# Patient Record
Sex: Male | Born: 1955 | Race: White | Hispanic: No | Marital: Married | Smoking: Current some day smoker
Health system: Southern US, Community
[De-identification: ages and names within clinical notes are randomized; demographics above are authoritative.]

## PROBLEM LIST (undated history)

## (undated) DIAGNOSIS — I10 Essential (primary) hypertension: Secondary | ICD-10-CM

## (undated) DIAGNOSIS — E785 Hyperlipidemia, unspecified: Secondary | ICD-10-CM

## (undated) HISTORY — PX: JOINT REPLACEMENT: SHX530

---

## 2004-05-07 ENCOUNTER — Encounter: Admission: RE | Admit: 2004-05-07 | Discharge: 2004-05-07 | Payer: Self-pay | Admitting: Orthopedic Surgery

## 2005-07-07 ENCOUNTER — Encounter: Admission: RE | Admit: 2005-07-07 | Discharge: 2005-07-07 | Payer: Self-pay | Admitting: Orthopedic Surgery

## 2006-06-02 ENCOUNTER — Ambulatory Visit: Payer: Self-pay | Admitting: Internal Medicine

## 2006-06-12 ENCOUNTER — Ambulatory Visit: Payer: Self-pay | Admitting: Internal Medicine

## 2006-08-21 ENCOUNTER — Encounter (INDEPENDENT_AMBULATORY_CARE_PROVIDER_SITE_OTHER): Payer: Self-pay | Admitting: *Deleted

## 2006-08-21 ENCOUNTER — Inpatient Hospital Stay (HOSPITAL_COMMUNITY): Admission: RE | Admit: 2006-08-21 | Discharge: 2006-08-23 | Payer: Self-pay | Admitting: Orthopedic Surgery

## 2009-06-01 ENCOUNTER — Inpatient Hospital Stay (HOSPITAL_COMMUNITY): Admission: RE | Admit: 2009-06-01 | Discharge: 2009-06-03 | Payer: Self-pay | Admitting: Orthopedic Surgery

## 2009-06-11 ENCOUNTER — Emergency Department (HOSPITAL_COMMUNITY): Admission: EM | Admit: 2009-06-11 | Discharge: 2009-06-11 | Payer: Self-pay | Admitting: Family Medicine

## 2009-06-26 ENCOUNTER — Inpatient Hospital Stay (HOSPITAL_COMMUNITY): Admission: EM | Admit: 2009-06-26 | Discharge: 2009-06-26 | Payer: Self-pay | Admitting: Emergency Medicine

## 2009-07-02 ENCOUNTER — Observation Stay (HOSPITAL_COMMUNITY): Admission: EM | Admit: 2009-07-02 | Discharge: 2009-07-03 | Payer: Self-pay | Admitting: Emergency Medicine

## 2010-09-27 LAB — DIFFERENTIAL
Basophils Absolute: 0.1 10*3/uL (ref 0.0–0.1)
Basophils Absolute: 0.1 10*3/uL (ref 0.0–0.1)
Basophils Relative: 1 % (ref 0–1)
Basophils Relative: 1 % (ref 0–1)
Eosinophils Absolute: 0.2 10*3/uL (ref 0.0–0.7)
Eosinophils Absolute: 0.2 10*3/uL (ref 0.0–0.7)
Eosinophils Relative: 1 % (ref 0–5)
Eosinophils Relative: 2 % (ref 0–5)
Lymphocytes Relative: 18 % (ref 12–46)
Lymphocytes Relative: 21 % (ref 12–46)
Lymphs Abs: 2.7 10*3/uL (ref 0.7–4.0)
Lymphs Abs: 2.2 10*3/uL (ref 0.7–4.0)
Monocytes Absolute: 1.5 10*3/uL — ABNORMAL HIGH (ref 0.1–1.0)
Monocytes Absolute: 0.8 10*3/uL (ref 0.1–1.0)
Monocytes Relative: 10 % (ref 3–12)
Monocytes Relative: 8 % (ref 3–12)
Neutro Abs: 10.3 10*3/uL — ABNORMAL HIGH (ref 1.7–7.7)
Neutro Abs: 7.4 10*3/uL (ref 1.7–7.7)
Neutrophils Relative %: 70 % (ref 43–77)
Neutrophils Relative %: 69 % (ref 43–77)

## 2010-09-27 LAB — CBC
HCT: 34.5 % — ABNORMAL LOW (ref 39.0–52.0)
HCT: 29.8 % — ABNORMAL LOW (ref 39.0–52.0)
Hemoglobin: 11.9 g/dL — ABNORMAL LOW (ref 13.0–17.0)
Hemoglobin: 10.1 g/dL — ABNORMAL LOW (ref 13.0–17.0)
MCHC: 34.6 g/dL (ref 30.0–36.0)
MCHC: 34 g/dL (ref 30.0–36.0)
MCV: 92.5 fL (ref 78.0–100.0)
MCV: 92 fL (ref 78.0–100.0)
Platelets: 597 10*3/uL — ABNORMAL HIGH (ref 150–400)
Platelets: 374 10*3/uL (ref 150–400)
RBC: 3.73 MIL/uL — ABNORMAL LOW (ref 4.22–5.81)
RBC: 3.23 MIL/uL — ABNORMAL LOW (ref 4.22–5.81)
RDW: 12.5 % (ref 11.5–15.5)
RDW: 13.5 % (ref 11.5–15.5)
WBC: 14.8 10*3/uL — ABNORMAL HIGH (ref 4.0–10.5)
WBC: 10.7 10*3/uL — ABNORMAL HIGH (ref 4.0–10.5)

## 2010-09-27 LAB — POCT I-STAT, CHEM 8
BUN: 46 mg/dL — ABNORMAL HIGH (ref 6–23)
BUN: 24 mg/dL — ABNORMAL HIGH (ref 6–23)
Calcium, Ion: 1.19 mmol/L (ref 1.12–1.32)
Calcium, Ion: 1.15 mmol/L (ref 1.12–1.32)
Chloride: 107 mEq/L (ref 96–112)
Chloride: 107 mEq/L (ref 96–112)
Creatinine, Ser: 2.8 mg/dL — ABNORMAL HIGH (ref 0.4–1.5)
Creatinine, Ser: 1.5 mg/dL (ref 0.4–1.5)
Glucose, Bld: 81 mg/dL (ref 70–99)
Glucose, Bld: 97 mg/dL (ref 70–99)
HCT: 36 % — ABNORMAL LOW (ref 39.0–52.0)
HCT: 31 % — ABNORMAL LOW (ref 39.0–52.0)
Hemoglobin: 12.2 g/dL — ABNORMAL LOW (ref 13.0–17.0)
Hemoglobin: 10.5 g/dL — ABNORMAL LOW (ref 13.0–17.0)
Potassium: 4.9 mEq/L (ref 3.5–5.1)
Potassium: 3.4 mEq/L — ABNORMAL LOW (ref 3.5–5.1)
Sodium: 139 mEq/L (ref 135–145)
Sodium: 141 mEq/L (ref 135–145)
TCO2: 29 mmol/L (ref 0–100)
TCO2: 21 mmol/L (ref 0–100)

## 2010-09-27 LAB — PROTIME-INR
INR: 1 (ref 0.00–1.49)
Prothrombin Time: 13.1 seconds (ref 11.6–15.2)

## 2010-09-27 LAB — APTT: aPTT: 27 seconds (ref 24–37)

## 2010-09-28 LAB — CBC
HCT: 30.6 % — ABNORMAL LOW (ref 39.0–52.0)
HCT: 31.1 % — ABNORMAL LOW (ref 39.0–52.0)
HCT: 39.4 % (ref 39.0–52.0)
Hemoglobin: 10.4 g/dL — ABNORMAL LOW (ref 13.0–17.0)
Hemoglobin: 10.6 g/dL — ABNORMAL LOW (ref 13.0–17.0)
Hemoglobin: 13.6 g/dL (ref 13.0–17.0)
MCHC: 33.9 g/dL (ref 30.0–36.0)
MCHC: 34 g/dL (ref 30.0–36.0)
MCHC: 34.4 g/dL (ref 30.0–36.0)
MCV: 92.4 fL (ref 78.0–100.0)
MCV: 93.3 fL (ref 78.0–100.0)
MCV: 93.3 fL (ref 78.0–100.0)
Platelets: 236 10*3/uL (ref 150–400)
Platelets: 270 10*3/uL (ref 150–400)
Platelets: 342 10*3/uL (ref 150–400)
RBC: 3.28 MIL/uL — ABNORMAL LOW (ref 4.22–5.81)
RBC: 3.34 MIL/uL — ABNORMAL LOW (ref 4.22–5.81)
RBC: 4.26 MIL/uL (ref 4.22–5.81)
RDW: 12.3 % (ref 11.5–15.5)
RDW: 12.6 % (ref 11.5–15.5)
RDW: 13 % (ref 11.5–15.5)
WBC: 10.7 10*3/uL — ABNORMAL HIGH (ref 4.0–10.5)
WBC: 11.8 10*3/uL — ABNORMAL HIGH (ref 4.0–10.5)
WBC: 14.8 10*3/uL — ABNORMAL HIGH (ref 4.0–10.5)

## 2010-09-28 LAB — URINE MICROSCOPIC-ADD ON

## 2010-09-28 LAB — URINALYSIS, ROUTINE W REFLEX MICROSCOPIC
Bilirubin Urine: NEGATIVE
Glucose, UA: NEGATIVE mg/dL
Hgb urine dipstick: NEGATIVE
Ketones, ur: NEGATIVE mg/dL
Leukocytes, UA: NEGATIVE
Nitrite: NEGATIVE
Protein, ur: 100 mg/dL — AB
Specific Gravity, Urine: 1.026 (ref 1.005–1.030)
Urobilinogen, UA: 0.2 mg/dL (ref 0.0–1.0)
pH: 7 (ref 5.0–8.0)

## 2010-09-28 LAB — BASIC METABOLIC PANEL
BUN: 18 mg/dL (ref 6–23)
BUN: 23 mg/dL (ref 6–23)
CO2: 27 mEq/L (ref 19–32)
CO2: 29 mEq/L (ref 19–32)
Calcium: 8.3 mg/dL — ABNORMAL LOW (ref 8.4–10.5)
Calcium: 8.4 mg/dL (ref 8.4–10.5)
Chloride: 101 mEq/L (ref 96–112)
Chloride: 102 mEq/L (ref 96–112)
Creatinine, Ser: 1.11 mg/dL (ref 0.4–1.5)
Creatinine, Ser: 1.22 mg/dL (ref 0.4–1.5)
GFR calc Af Amer: >60 mL/min (ref >60)
GFR calc Af Amer: >60 mL/min (ref >60)
GFR calc non Af Amer: >60 mL/min (ref >60)
GFR calc non Af Amer: >60 mL/min (ref >60)
Glucose, Bld: 116 mg/dL — ABNORMAL HIGH (ref 70–99)
Glucose, Bld: 133 mg/dL — ABNORMAL HIGH (ref 70–99)
Potassium: 3.8 mEq/L (ref 3.5–5.1)
Potassium: 3.9 mEq/L (ref 3.5–5.1)
Sodium: 138 mEq/L (ref 135–145)
Sodium: 138 mEq/L (ref 135–145)

## 2010-09-28 LAB — COMPREHENSIVE METABOLIC PANEL
ALT: 107 U/L — ABNORMAL HIGH (ref 0–53)
AST: 45 U/L — ABNORMAL HIGH (ref 0–37)
Albumin: 4.3 g/dL (ref 3.5–5.2)
Alkaline Phosphatase: 81 U/L (ref 39–117)
BUN: 29 mg/dL — ABNORMAL HIGH (ref 6–23)
CO2: 28 mEq/L (ref 19–32)
Calcium: 9.2 mg/dL (ref 8.4–10.5)
Chloride: 105 mEq/L (ref 96–112)
Creatinine, Ser: 1.39 mg/dL (ref 0.4–1.5)
GFR calc Af Amer: >60 mL/min (ref >60)
GFR calc non Af Amer: 53 mL/min — ABNORMAL LOW (ref >60)
Glucose, Bld: 99 mg/dL (ref 70–99)
Potassium: 4.5 mEq/L (ref 3.5–5.1)
Sodium: 141 mEq/L (ref 135–145)
Total Bilirubin: 0.5 mg/dL (ref 0.3–1.2)
Total Protein: 7 g/dL (ref 6.0–8.3)

## 2010-09-28 LAB — PROTIME-INR
INR: 0.88 (ref 0.00–1.49)
Prothrombin Time: 11.9 seconds (ref 11.6–15.2)

## 2010-09-28 LAB — CROSSMATCH
ABO/RH(D): O POS
Antibody Screen: NEGATIVE

## 2010-09-28 LAB — DIFFERENTIAL
Basophils Absolute: 0.1 10*3/uL (ref 0.0–0.1)
Basophils Relative: 1 % (ref 0–1)
Eosinophils Absolute: 0.2 10*3/uL (ref 0.0–0.7)
Eosinophils Relative: 2 % (ref 0–5)
Lymphocytes Relative: 31 % (ref 12–46)
Lymphs Abs: 3.3 10*3/uL (ref 0.7–4.0)
Monocytes Absolute: 1.2 10*3/uL — ABNORMAL HIGH (ref 0.1–1.0)
Monocytes Relative: 11 % (ref 3–12)
Neutro Abs: 5.9 10*3/uL (ref 1.7–7.7)
Neutrophils Relative %: 55 % (ref 43–77)

## 2010-09-28 LAB — APTT: aPTT: 27 seconds (ref 24–37)

## 2010-11-12 NOTE — Op Note (Signed)
NAMEKENDEN, Tyler Tate              ACCOUNT NO.:  [id number]   MEDICAL RECORD NO.:  [id number]          PATIENT TYPE:  INP   LOCATION:  5012                         FACILITY:  [facility]   PHYSICIAN:  Mila Homer. Sherlean Foot, M.D. DATE OF BIRTH:  08/01/1955   DATE OF PROCEDURE:  08/21/2006  DATE OF DISCHARGE:  08/23/2006                               OPERATIVE REPORT   SURGEON:  Mila Homer. Sherlean Foot, M.D.   ASSISTANTArlys John D. Petrarca, P.A.-C.   PREOPERATIVE DIAGNOSIS:  Right hip osteoarthritis.   POSTOPERATIVE DIAGNOSIS:  Right hip osteoarthritis.   PROCEDURE:  Right total hip arthroplasty.   ANESTHESIA:  General.   COMPLICATIONS:  None.   DRAINS:  None.   INDICATIONS FOR PROCEDURE:  The patient is a 55 year old white male with  failure of conservative treatment for osteoarthritis of the right hip.  Informed consent was obtained.   DESCRIPTION OF PROCEDURE:  The patient was laid supine and administered  general anesthesia.  The right hip was prepped and draped in the usual  sterile fashion with the left-down, right lateral decubitus position.  A  #10 blade was used to make a southern incision directed over the center  of the trochanter.  I then placed a Charnley retractor after incising  the fascia lata.  I removed the bursa.  I then made an incision through  the interval of the anterior 1/2 of the gluteus medius, minimus and  lateralis.  I elevated this all as a single sleeve of tissue.  I tagged  it with 3 stay sutures with #2 Tevdek.  I then performed an anterior hip  capsulectomy with the hip in extension and external rotation.  I then  placed a Hohmann retractor anteriorly.  I then used the neck guide to  mark out the femoral neck cut and made the cut with a reciprocating saw.  I removed the cut surface of the neck and head and placed Hohmann  retractors anteriorly and posteriorly.  I switched sides of the table  with my physician assistant.  I cleaned out the acetabulum of the  labrum  and ligamentum teres.  I then reamed sequentially up to 58 mm and put in  a 60-mm, no-holes, no-spike cup, and then went to the back side of the  table.  I then flexed the hip and externally rotated into a sterile  pouch off the anterior side of the table.  I then used a canal finder,  followed by reamers.  I reamed up to a size 13, and then broached to a  size 13 as well.  At this point, I followed with a broach and broached  up to size 13.  At this point, I trialed with a 0 head and used a 36 x  +3.5 head and an anterior lip liner, since I felt that the acetabulum  was put in a little bit of anteversion.  With this liner and this head  in place, I had excellent stability and leg lengths.  I then dislocated  the hip and isolated the acetabulum.  I tamped in the acetabular liner,  which  was, again, to receive a 36 head with an anterior lip liner.  This  was placed in approximately the 2-o'clock position.  I then put a fully  porous-coated, size-13 stem down with a +3.5 x 36-mm head, located the  hip and took it through an aggressive  motion and was very happy with this.  I then lavaged and closed the  medius, minimus, lateralis interval through drill holes, and oversewed  with figure-of-eight #2 Tevdek sutures.  I then closed the fascia lata  with interrupted #1 Vicryl, the deep soft tissues with interrupted 0  Vicryl, subcuticular 0 Vicryl and skin staples.           ______________________________  Mila Homer Sherlean Foot, M.D.     SDL/MEDQ  D:  08/25/2006  T:  08/26/2006  Job:  [id number]

## 2010-11-12 NOTE — H&P (Signed)
Tyler Tate, Tyler Tate               ACCOUNT NO.:  [id number]   MEDICAL RECORD NO.:  [id number]           PATIENT TYPE:   LOCATION:                                 FACILITY:   PHYSICIAN:  Mila Homer. Sherlean Foot, M.D. DATE OF BIRTH:  1956/01/04   DATE OF ADMISSION:  08/21/2006  DATE OF DISCHARGE:                              HISTORY & PHYSICAL   CHIEF COMPLAINT:  End-stage osteoarthritis right hip.   HISTORY OF PRESENT ILLNESS:  Tyler Tate is a 55 year old white  male with right hip pain for at least 2 years.  The pain is  progressively worse and described as constant right groin, lateral right  hip pain without radiation.  Positive aching pain.  Failed conservative  treatment which included intraarticular injections.  X-rays of the right  hip show decreased joint space with a very irregular femoral head.   ALLERGIES:  Penicillin causes hives.   MEDICATIONS:  1. Effexor XR 150 mg one daily.  2. Lipitor 40 mg one daily in the a.m.  3. Benicar HCT 12.5 mg one every a.m.  4. Toprol XL 50 mg one every a.m.  5. Prilosec over-the-counter 1 tablet daily.  6. Cosamin chondroitin daily.  7. Fish oil daily.  8. Multi-vitamin daily.  9. Enteric-coated aspirin 81 mg one daily, stop August 15, 2006.   PAST MEDICAL HISTORY:  1. Dyslipidemia.  2. Hypertension.  3. GERD.  4. Seasonal allergies.   PAST SURGICAL HISTORY:  1. Vasectomy.  2. Lumbar spine either L3-L4, or L4-L5 in 1995.  No complications, no      blood transfusions.   SOCIAL HISTORY:  The patient denies any tobacco use.  Drinks 5 to 7  alcoholic beverages per week.  He is married.  He lives in a 2-story  home with 3 steps at the usual entrance, bedroom is on the second floor  15 steps up.   FAMILY HISTORY:  The patient's mother is deceased at age 2 due to  ovarian cancer.  Father, age 74, has a history of nephritis as a child.  He has one living sister who is reportedly healthy.   REVIEW OF SYSTEMS:  Reveals  hypertension, GERD, dyslipidemia, seasonal  allergies.  No recent cold, fever, or flu-like symptoms.  No history of  anemia.  Otherwise, review of systems negative or noncontributory.   PHYSICAL EXAMINATION:  GENERAL:  The patient is a well-developed, well-  nourished male in no acute distress.  The patient does walk with a  slight antalgic gait on the right, no assistive devices.  The patient  mentates perfect, talks easily in the examination room.  VITAL SIGNS:  Temperature is 98.1 degrees Fahrenheit, pulse 68,  respiratory rate 16, blood pressure 120/70.  HEENT:  Head is normocephalic, atraumatic without frontal or maxillary  sinus tenderness to palpation.  Sclerae is nonicteric.  PERRLA.  EOMs  are intact.  Conjunctivae pink.  No visible external ear deformities.  TMs pearly and gray.  Nose, nares, and septum midline.  Nasal mucosa  pink, moist without polyps.  Buccal mucosa is pink and moist.  The  patient has good dentition.  Oropharynx without erythema or exudate.  CARDIAC:  Regular rate and rhythm.  No murmurs, rubs, or gallops.  CHEST:  Lungs are clear to auscultation bilaterally.  No wheezes, rales,  or rhonchi noted.  ABDOMEN:  Soft, nontender.  Positive bowel sounds times 4 quadrants.  No  hepatomegaly.  No splenomegaly.  NECK:  The patient has full range of motion of the cervical spine  without radicular symptoms.  No lymphadenopathy.  Carotids 2+  bilaterally without bruits.  Trachea is midline.  BACK:  No tenderness to palpation over the lumbar and thoracic vertebral  columns.  BREAST/GENITALIA/URINARY/RECTAL EXAM:  All deferred at this time.  NEUROLOGICAL:  The patient is alert and oriented times 3.  Cranial  nerves II-XII are grossly intact.  Lower extremity strength testing  reveals 5/5 strength throughout.  Deep tendon reflexes are 2+ at the  pelvis and ankles bilaterally.  They are equal and symmetric.  MUSCULOSKELETAL:  Upper extremities; the patient has full range  of  motion in the upper extremities, shoulders, elbows, wrists, and hands.  Radial pulses are 2+ bilaterally.  The patient has good sensation of  fingertips throughout.  Lower extremities;  Left hip has painless range  of motion.  25 degrees of internal rotation, external rotation 30  degrees.  Right hip 5 degrees of internal rotation and is painful.  External rotation is limited to 15 degrees.  Knees:  Bilaterally without  fusion, edema.  Left knee is 0 to 115 degrees of flexion.  Right knee is  0 to 118 degrees of flexion.  There is no joint line tenderness.  Calves  bilaterally are soft and nontender.  Posterior tibial pulses are 2+  bilaterally.   IMPRESSION:  1. End-stage osteoarthritis of the right hip.  2. Hypertension.  3. Dyslipidemia.  4. GERD.  5. Seasonal allergies.   PLAN:  The patient is to undergo all preoperative labs and testing prior  to surgery.  The patient did receive clearance from his primary care  physician, Dr. Geoffry Paradise, prior to surgery.      Richardean Canal, P.A.    ______________________________  Mila Homer. Sherlean Foot, M.D.    GC/MEDQ  D:  08/15/2006  T:  08/16/2006  Job:  [id number]   cc:   Mila Homer. Sherlean Foot, M.D.

## 2011-06-04 ENCOUNTER — Encounter: Payer: Self-pay | Admitting: Internal Medicine

## 2012-03-23 ENCOUNTER — Encounter: Payer: Self-pay | Admitting: Internal Medicine

## 2014-07-24 DIAGNOSIS — R52 Pain, unspecified: Secondary | ICD-10-CM

## 2015-04-28 ENCOUNTER — Other Ambulatory Visit: Payer: Self-pay | Admitting: Orthopedic Surgery

## 2015-04-28 DIAGNOSIS — M25562 Pain in left knee: Secondary | ICD-10-CM

## 2015-05-03 ENCOUNTER — Ambulatory Visit
Admission: RE | Admit: 2015-05-03 | Discharge: 2015-05-03 | Disposition: A | Discharge status: Home / Self Care | Payer: BLUE CROSS/BLUE SHIELD | Source: Ambulatory Visit | Attending: Orthopedic Surgery | Admitting: Orthopedic Surgery

## 2015-05-03 DIAGNOSIS — M25562 Pain in left knee: Secondary | ICD-10-CM

## 2015-09-14 ENCOUNTER — Encounter: Payer: Self-pay | Admitting: Podiatry

## 2015-09-14 ENCOUNTER — Ambulatory Visit (INDEPENDENT_AMBULATORY_CARE_PROVIDER_SITE_OTHER): Payer: BLUE CROSS/BLUE SHIELD

## 2015-09-14 ENCOUNTER — Ambulatory Visit (INDEPENDENT_AMBULATORY_CARE_PROVIDER_SITE_OTHER): Payer: BLUE CROSS/BLUE SHIELD | Admitting: Podiatry

## 2015-09-14 VITALS — BP 166/104 | HR 89 | Resp 16 | Ht 73.0 in | Wt 220.0 lb

## 2015-09-14 DIAGNOSIS — M79671 Pain in right foot: Secondary | ICD-10-CM | POA: Diagnosis not present

## 2015-09-14 DIAGNOSIS — M205X1 Other deformities of toe(s) (acquired), right foot: Secondary | ICD-10-CM | POA: Diagnosis not present

## 2015-09-14 DIAGNOSIS — M779 Enthesopathy, unspecified: Secondary | ICD-10-CM | POA: Diagnosis not present

## 2015-09-14 MED ORDER — TRIAMCINOLONE ACETONIDE 10 MG/ML IJ SUSP
10.0000 mg | Freq: Once | INTRAMUSCULAR | Status: AC
  Administered 2015-09-14: 10 mg

## 2015-09-14 NOTE — Progress Notes (Signed)
   Subjective:    Patient ID: Tyler Tate, male    DOB: [birth year], 60 y.o.   MRN: [id number]  HPI Patient presents with foot pain in their right foot; great toe & lateral. Pt stated, "Thinks may have gout; hurts more when walk on foot"; x2 weeks.   Review of Systems  All other systems reviewed and are negative.      Objective:   Physical Exam        Assessment & Plan:

## 2015-09-15 NOTE — Progress Notes (Signed)
Subjective:     Patient ID: Tyler Tate, male   DOB: 12/22/1955, 60 y.o.   MRN: [id number]  HPI patient presents with quite a bit of discomfort in the lateral side of the right foot and also pain with discomfort around the first MPJ right with limited motion and quite a bit of pain when we tried to palpate the joint surface   Review of Systems     Objective:   Physical Exam Neurovascular status was found to be intact muscle strength adequate range of motion within normal limits. Patient's found to have reduced range of motion first MPJ right with crepitus within the joint and spur formation. There is fluid around the joint with pain and on the lateral side of the right foot within the peroneal group there is quite a bit of discomfort as it inserts into the base of the fifth metatarsal with no loss of tendon function    Assessment:     Inflammatory changes consistent with hallux limitus with capsulitis and compensatory tendinitis lateral foot    Plan:     H&P and conditions reviewed with patient. Today I went ahead and injected the first MPJ 3 mg Kenalog 5 mg Xylocaine and the lateral foot 3 mg Kenalog 5 mg Xylocaine and applied fascial brace to lift up the lateral foot. Instructed on ice and reduced activity and reappoint to recheck again in 4 weeks  X-ray report indicated there is quite a bit of spurring around the first MPJ right with narrowing of the joint surface and indications of arthritis. No indications of pathology lateral

## 2015-10-05 ENCOUNTER — Ambulatory Visit (INDEPENDENT_AMBULATORY_CARE_PROVIDER_SITE_OTHER): Payer: BLUE CROSS/BLUE SHIELD | Admitting: Podiatry

## 2015-10-05 DIAGNOSIS — M205X1 Other deformities of toe(s) (acquired), right foot: Secondary | ICD-10-CM | POA: Diagnosis not present

## 2015-10-05 DIAGNOSIS — M779 Enthesopathy, unspecified: Secondary | ICD-10-CM | POA: Diagnosis not present

## 2015-10-06 NOTE — Progress Notes (Signed)
Subjective:     Patient ID: Tyler Tate, male   DOB: 1955/09/24, [age] y.o.   MRN: [id number]  HPI patient states the toe joint feels quite a bit better but it staff and I know some nail probably need surgery but I like to hold off as long as possible   Review of Systems     Objective:   Physical Exam Neurovascular status intact muscle strength adequate with severe range of motion loss first MPJ right with inflammation and fluid around the joint surface still noted but significant diminishment of discomfort    Assessment:     Improved hallux rigidus condition right with no motion at the first MPJ    Plan:     H&P and condition reviewed. At this point were to placement an orthotic to try to stabilize the first MPJ prevent stress on it and hopefully prevent surgery. Ultimately will probably require joint fusion or joint implantation procedure depending on symptoms orthotics today

## 2015-10-27 ENCOUNTER — Ambulatory Visit (INDEPENDENT_AMBULATORY_CARE_PROVIDER_SITE_OTHER): Payer: BLUE CROSS/BLUE SHIELD | Admitting: *Deleted

## 2015-10-27 DIAGNOSIS — M779 Enthesopathy, unspecified: Secondary | ICD-10-CM

## 2015-10-27 NOTE — Progress Notes (Signed)
Patient ID: Tyler Tate, male   DOB: 08-22-1955, 60 y.o.   MRN: [id number] Patient presents for orthotic pick up.  Verbal and written break in and wear instructions given.  Patient will follow up in 4 weeks if symptoms worsen or fail to improve.

## 2015-10-27 NOTE — Patient Instructions (Signed)

## 2018-01-17 ENCOUNTER — Encounter: Payer: Self-pay | Admitting: Podiatry

## 2018-01-17 ENCOUNTER — Ambulatory Visit: Payer: Self-pay | Admitting: Podiatry

## 2018-01-17 ENCOUNTER — Ambulatory Visit (INDEPENDENT_AMBULATORY_CARE_PROVIDER_SITE_OTHER): Payer: Self-pay

## 2018-01-17 ENCOUNTER — Other Ambulatory Visit: Payer: Self-pay | Admitting: Podiatry

## 2018-01-17 VITALS — BP 140/84 | HR 73

## 2018-01-17 DIAGNOSIS — M79671 Pain in right foot: Secondary | ICD-10-CM

## 2018-01-17 DIAGNOSIS — M7661 Achilles tendinitis, right leg: Secondary | ICD-10-CM

## 2018-01-17 DIAGNOSIS — M205X1 Other deformities of toe(s) (acquired), right foot: Secondary | ICD-10-CM

## 2018-01-17 MED ORDER — DICLOFENAC SODIUM 75 MG PO TBEC: 75.0000 mg | DELAYED_RELEASE_TABLET | Freq: Two times a day (BID) | ORAL | 2 refills | Status: DC

## 2018-01-17 MED ORDER — TRIAMCINOLONE ACETONIDE 10 MG/ML IJ SUSP
10.0000 mg | Freq: Once | INTRAMUSCULAR | Status: AC
  Administered 2018-01-17: 10 mg

## 2018-01-17 NOTE — Progress Notes (Signed)
Subjective:   Patient ID: Tyler ChiquitoPhillip Engebretsen, male   DOB: 62 y.o.   MRN: [id number]   HPI Patient presents stating I am having a lot of pain in the back of my right heel and I like to be active and is very hard for me even to sleep when it hits the ground.  It just started on Monday and I do not remember an injury   ROS      Objective:  Physical Exam  Neurovascular status intact with patient noted to have exquisite discomfort posterior lateral aspect right heel at the insertional point tendon calcaneus with mild equinus but no indication of tendon damage.  Also has significant hallux limitus deformity right with reduced range of motion and digital deformity second digit right foot     Assessment:  Posterior Achilles tendinitis right acute mild nature probable local with possibility for systemic problems and hallux limitus condition reviewed both conditions and I discussed injection explaining chances for rupture and I do not immobilize it after doing the procedure.  He wants to pursue this action and at this point     Plan:  I did a sterile prep of the lateral side of the right Achilles and carefully injected the side not into the center medial side 3 mg dexamethasone Kenalog 5 mg Xylocaine and I applied air fracture walker to completely immobilize.  I then went ahead discussed hallux limitus and we discussed ultimate surgery and he will continue with orthotics.  Patient to be seen back 3 weeks and I did dispense a silicone sleeve to wear when he has to wear shoe for the right foot  X-ray indicates significant severe hallux limitus deformity right with narrow joint motion and minimal spurring posterior heel

## 2018-01-17 NOTE — Patient Instructions (Signed)

## 2018-01-22 ENCOUNTER — Telehealth: Payer: Self-pay | Admitting: Podiatry

## 2018-01-22 NOTE — Telephone Encounter (Signed)
Called pt and left a voicemail letting him know that his office visit note he requested from 24 July is at the front desk for him to pick up at his convenience. I told him if he wanted to wait until his next appointment on 14 August to pick it up that was fine. Told him to call me with any questions at [phone] and to leave a message if I do not answer.

## 2018-02-07 ENCOUNTER — Ambulatory Visit: Payer: BLUE CROSS/BLUE SHIELD | Admitting: Podiatry

## 2019-05-30 ENCOUNTER — Ambulatory Visit (INDEPENDENT_AMBULATORY_CARE_PROVIDER_SITE_OTHER): Payer: BC Managed Care – PPO | Admitting: Podiatry

## 2019-05-30 ENCOUNTER — Ambulatory Visit (INDEPENDENT_AMBULATORY_CARE_PROVIDER_SITE_OTHER): Payer: BC Managed Care – PPO

## 2019-05-30 ENCOUNTER — Encounter: Payer: Self-pay | Admitting: Podiatry

## 2019-05-30 ENCOUNTER — Other Ambulatory Visit: Payer: Self-pay

## 2019-05-30 DIAGNOSIS — M2041 Other hammer toe(s) (acquired), right foot: Secondary | ICD-10-CM

## 2019-05-30 DIAGNOSIS — M2042 Other hammer toe(s) (acquired), left foot: Secondary | ICD-10-CM

## 2019-05-30 DIAGNOSIS — M205X1 Other deformities of toe(s) (acquired), right foot: Secondary | ICD-10-CM | POA: Diagnosis not present

## 2019-05-31 ENCOUNTER — Telehealth: Payer: Self-pay | Admitting: Podiatry

## 2019-05-31 NOTE — Telephone Encounter (Signed)
I saw Dr. Paulla Dolly yesterday and I'm calling to schedule my surgery. If I can, I'd like to get it scheduled for 12/22. I went ahead and scheduled the pt a sx consult appointment on 12/11 at 10:15 to go over and sign the consent forms.

## 2019-06-03 ENCOUNTER — Telehealth: Payer: Self-pay | Admitting: Podiatry

## 2019-06-03 NOTE — Telephone Encounter (Signed)
DOS: 06/18/2019  SURGICAL PROCEDURE: Hammertoe Repair w/ Pin 2nd [id number])  BCBS Policy Effective : 09/81/1914  -  06/27/2019  Member Liability Summary       In-Network   Max Per Benefit Period Year-to-Date Remaining     CoInsurance 0%       Deductible $8150.00 $7829.56     Out-Of-Pocket 3 $8150.00 $7767.17 3 Out-of-Pocket includes copay, deductible, and coinsurance.  [place] Required Not Applicable 0%  per  Service Year No

## 2019-06-03 NOTE — Progress Notes (Signed)
Subjective:   Patient ID: Tyler Tate, male   DOB: 63 y.o.   MRN: [id number]   HPI Patient presents stating the second toe on his right foot has lifted quite significantly and it has become sore recently.  He does not remember specific injury but states it is been ongoing and gradually getting worse over time.  The left has mild deformity but not to the same degree and this has been recent.   ROS      Objective:  Physical Exam  Neurovascular status intact muscle strength found to be adequate with significant elevation of the second digit right foot with elongation and irritation of the proximal phalanx head secondary to irritation     Assessment:  Rigid hammertoe deformity second digit right     Plan:  H&P discussed the nature of deformity in the probability of a flexor plate dislocation.  At this point I do think it would require digital fusion and I reviewed that with the patient and patient understands this and at this time will decide best time and then schedule surgery and I will see him prior to go over greater detail.  I did apply padding to try to take pressure off the toe  X-ray indicates there is medial and dorsal dislocation second digit right foot over left foot

## 2019-06-07 ENCOUNTER — Encounter: Payer: Self-pay | Admitting: Podiatry

## 2019-06-07 ENCOUNTER — Ambulatory Visit (INDEPENDENT_AMBULATORY_CARE_PROVIDER_SITE_OTHER): Payer: BC Managed Care – PPO | Admitting: Podiatry

## 2019-06-07 ENCOUNTER — Other Ambulatory Visit: Payer: Self-pay

## 2019-06-07 DIAGNOSIS — M7751 Other enthesopathy of right foot: Secondary | ICD-10-CM | POA: Diagnosis not present

## 2019-06-07 DIAGNOSIS — M2042 Other hammer toe(s) (acquired), left foot: Secondary | ICD-10-CM | POA: Diagnosis not present

## 2019-06-07 DIAGNOSIS — M2041 Other hammer toe(s) (acquired), right foot: Secondary | ICD-10-CM | POA: Diagnosis not present

## 2019-06-07 DIAGNOSIS — M779 Enthesopathy, unspecified: Secondary | ICD-10-CM

## 2019-06-07 NOTE — Patient Instructions (Signed)
Pre-Operative Instructions  Congratulations, you have decided to take an important step towards improving your quality of life.  You can be assured that the doctors and staff at [facility] will be with you every step of the way.  Here are some important things you should know:  1. Plan to be at the surgery center/hospital at least 1 (one) hour prior to your scheduled time, unless otherwise directed by the surgical center/hospital staff.  You must have a responsible adult accompany you, remain during the surgery and drive you home.  Make sure you have directions to the surgical center/hospital to ensure you arrive on time. 2. If you are having surgery at [facility] or  hospitals, you will need a copy of your medical history and physical form from your family physician within one month prior to the date of surgery. We will give you a form for your primary physician to complete.  3. We make every effort to accommodate the date you request for surgery.  However, there are times where surgery dates or times have to be moved.  We will contact you as soon as possible if a change in schedule is required.   4. No aspirin/ibuprofen for one week before surgery.  If you are on aspirin, any non-steroidal anti-inflammatory medications (Mobic, Aleve, Ibuprofen) should not be taken seven (7) days prior to your surgery.  You make take Tylenol for pain prior to surgery.  5. Medications - If you are taking daily heart and blood pressure medications, seizure, reflux, allergy, asthma, anxiety, pain or diabetes medications, make sure you notify the surgery center/hospital before the day of surgery so they can tell you which medications you should take or avoid the day of surgery. 6. No food or drink after midnight the night before surgery unless directed otherwise by surgical center/hospital staff. 7. No alcoholic beverages 24-hours prior to surgery.  No smoking 24-hours prior or 24-hours after  surgery. 8. Wear loose pants or shorts. They should be loose enough to fit over bandages, boots, and casts. 9. Don't wear slip-on shoes. Sneakers are preferred. 10. Bring your boot with you to the surgery center/hospital.  Also bring crutches or a walker if your physician has prescribed it for you.  If you do not have this equipment, it will be provided for you after surgery. 11. If you have not been contacted by the surgery center/hospital by the day before your surgery, call to confirm the date and time of your surgery. 12. Leave-time from work may vary depending on the type of surgery you have.  Appropriate arrangements should be made prior to surgery with your employer. 13. Prescriptions will be provided immediately following surgery by your doctor.  Fill these as soon as possible after surgery and take the medication as directed. Pain medications will not be refilled on weekends and must be approved by the doctor. 14. Remove nail polish on the operative foot and avoid getting pedicures prior to surgery. 15. Wash the night before surgery.  The night before surgery wash the foot and leg well with water and the antibacterial soap provided. Be sure to pay special attention to beneath the toenails and in between the toes.  Wash for at least three (3) minutes. Rinse thoroughly with water and dry well with a towel.  Perform this wash unless told not to do so by your physician.  Enclosed: 1 Ice pack (please put in freezer the night before surgery)   1 Hibiclens skin cleaner     Pre-op instructions  If you have any questions regarding the instructions, please do not hesitate to call our office.  [place]: [address], [place], [place] [address] -- [phone]  [place]: [address] [place], [place] [address] -- [phone]  [place]: [address], [place], [place] [address] -- [phone]   Website: [email or web address] 

## 2019-06-10 NOTE — Progress Notes (Signed)
Subjective:   Patient ID: Cheri Guppy, male   DOB: 63 y.o.   MRN: [id number]   HPI Patient presents with chronic digital deformity second right with probability for flexor plate dislocation and inflammation around his first MPJ right which has become worse recently with long-term arthritis of the joint   ROS      Objective:  Physical Exam  Neurovascular status intact with severe elevation digit to right with irritative dorsal tissue and also inflammation pain around the first MPJ right both dorsal and plantar to the first MPJ with warmth around the area     Assessment:  Severe hammertoe deformity with an acute probable flexor plate stretch tear and inflammatory first MPJ capsulitis along with hallux limitus deformity and severe arthritis of the first MPJ     Plan:  H&P reviewed conditions and at this point were going to pursue digital fusion digit to right and I discussed the first MPJ and we went over different treatment options we will get a go ahead and I injected the joint today 3 mg Kenalog 5 mg Xylocaine.  I allowed patient to read consent form going over the treatment for the toe and reviewed all possible complications and the fact most likely I will not be able to lower the step toe completely due to the severe dislocation but I do think it will eradicate the pain that he experiences with every step in shoe gear.  Patient understands this wants surgery signed consent form and is scheduled for outpatient surgery and also we may inject the plantar capsule if it remains symptomatic at the time of procedure

## 2019-06-17 MED ORDER — HYDROCODONE-ACETAMINOPHEN 10-325 MG PO TABS: 1.0000 | ORAL_TABLET | Freq: Three times a day (TID) | ORAL | 0 refills | Status: AC | PRN

## 2019-06-17 NOTE — Addendum Note (Signed)
Addended by: Wallene Huh on: 06/17/2019 06:09 PM   Modules accepted: Orders

## 2019-06-18 DIAGNOSIS — M2041 Other hammer toe(s) (acquired), right foot: Secondary | ICD-10-CM | POA: Diagnosis not present

## 2019-06-19 ENCOUNTER — Telehealth: Payer: Self-pay | Admitting: Podiatry

## 2019-06-19 ENCOUNTER — Telehealth: Payer: Self-pay

## 2019-06-19 NOTE — Telephone Encounter (Signed)
POST OP CALL-    1) General condition stated by the patient:   Pt stated that he is doing a lot better, pt states he did bump it and the bobble head came off  2) Is the pt having pain?  No, only on the bottom of the foot  3) Pain score:  2 out of 10  4) Has the pt taken Rx'd pain medication, regularly or PRN?  PRN  5) Is the pain medication giving relief? Yes  6) Any fever, chills, nausea, or vomiting, shortness of breath or tightness in calf? NO  7) Is the bandage clean, dry and intact? Yes  8) Is there excessive tightness, bleeding or drainage coming through the bandage? No  9) Did you understand all of the post op instruction sheet given? NO  10) Any questions or concerns regarding post op care/recovery? NO   Confirmed POV appointment with patient  YES

## 2019-06-19 NOTE — Telephone Encounter (Signed)
I informed pt that it was not unusual for the tip of the pin to come off, to cover with a light gauze dressing and sleep in the boot.

## 2019-06-19 NOTE — Telephone Encounter (Signed)
Pt had hammertoe surgery yesterday and states he is not having any pain or trouble but seems that sometime throughout the night he bumped his foot and the head of his pin has come off. Pt states it does not look like the pin has moved and he has no pain but wanted to let us know.

## 2019-06-24 ENCOUNTER — Other Ambulatory Visit: Payer: Self-pay

## 2019-06-24 ENCOUNTER — Encounter: Payer: Self-pay | Admitting: Podiatry

## 2019-06-24 ENCOUNTER — Ambulatory Visit (INDEPENDENT_AMBULATORY_CARE_PROVIDER_SITE_OTHER): Payer: BC Managed Care – PPO

## 2019-06-24 ENCOUNTER — Ambulatory Visit (INDEPENDENT_AMBULATORY_CARE_PROVIDER_SITE_OTHER): Payer: BC Managed Care – PPO | Admitting: Podiatry

## 2019-06-24 DIAGNOSIS — M2041 Other hammer toe(s) (acquired), right foot: Secondary | ICD-10-CM | POA: Diagnosis not present

## 2019-06-24 DIAGNOSIS — M2042 Other hammer toe(s) (acquired), left foot: Secondary | ICD-10-CM

## 2019-06-25 NOTE — Progress Notes (Signed)
Subjective:   Patient ID: Tyler Tate, male   DOB: 63 y.o.   MRN: [id number]   HPI Patient states doing well with toe with fixation in place and having minimal discomfort   ROS      Objective:  Physical Exam  Neurovascular status intact with patient second digit right in good alignment with slight elevation as the third toe big toe do converge on it but it is straight and the pin is in place with wound edges well coapted     Assessment:  Overall doing well after having digital fusion and release of the MPJ second digit right     Plan:  H&P reviewed condition and went ahead reapplied sterile dressing and instructed on keeping the toe plantarflexed.  Patient will be seen back 2 weeks suture removal or earlier if needed  X-rays indicate that the pin is in good alignment sutures intact digit in good position

## 2019-07-03 ENCOUNTER — Encounter: Payer: Self-pay | Admitting: Podiatry

## 2019-07-03 ENCOUNTER — Ambulatory Visit (INDEPENDENT_AMBULATORY_CARE_PROVIDER_SITE_OTHER): Payer: BC Managed Care – PPO | Admitting: Podiatry

## 2019-07-03 ENCOUNTER — Other Ambulatory Visit: Payer: Self-pay

## 2019-07-03 ENCOUNTER — Ambulatory Visit (INDEPENDENT_AMBULATORY_CARE_PROVIDER_SITE_OTHER): Payer: BC Managed Care – PPO

## 2019-07-03 DIAGNOSIS — M79671 Pain in right foot: Secondary | ICD-10-CM

## 2019-07-03 DIAGNOSIS — M2041 Other hammer toe(s) (acquired), right foot: Secondary | ICD-10-CM | POA: Diagnosis not present

## 2019-07-03 DIAGNOSIS — M2042 Other hammer toe(s) (acquired), left foot: Secondary | ICD-10-CM | POA: Diagnosis not present

## 2019-07-03 NOTE — Progress Notes (Signed)
Subjective:   Patient ID: Eleonore Chiquito, male   DOB: 64 y.o.   MRN: [id number]   HPI Patient states his toe got caught up in the pin got pulled in a distal direction and the cap came off   ROS      Objective:  Physical Exam  Neurovascular status intact negative Denna Haggard' sign noted with pin that is moved out about half inch on the right second toe with stitches intact     Assessment:  Pin that has been moved distally right second toe with mild elevation of the digit despite MPJ release     Plan:  X-rays reviewed and stitches removed today and I went ahead and anesthetized the right second toe and push the pin back in a proximal direction and it came into an excellent position.  I then dispensed a fascial type brace to reduce swelling above ankle and also it has an attachment to lower the second toe and he will wear this and be seen back in approximately 3 weeks for pin removal  X-rays indicate the pin did move out about 1/2 inch distally but still was crossed across the inner phalangeal joint

## 2019-07-08 ENCOUNTER — Encounter: Payer: BC Managed Care – PPO | Admitting: Podiatry

## 2019-07-22 ENCOUNTER — Other Ambulatory Visit: Payer: Self-pay

## 2019-07-22 ENCOUNTER — Encounter: Payer: BC Managed Care – PPO | Admitting: Podiatry

## 2019-07-22 ENCOUNTER — Ambulatory Visit (INDEPENDENT_AMBULATORY_CARE_PROVIDER_SITE_OTHER): Payer: BC Managed Care – PPO

## 2019-07-22 ENCOUNTER — Encounter: Payer: Self-pay | Admitting: Podiatry

## 2019-07-22 ENCOUNTER — Ambulatory Visit (INDEPENDENT_AMBULATORY_CARE_PROVIDER_SITE_OTHER): Payer: BC Managed Care – PPO | Admitting: Podiatry

## 2019-07-22 VITALS — BP 157/89 | HR 92 | Temp 97.6°F

## 2019-07-22 DIAGNOSIS — M2042 Other hammer toe(s) (acquired), left foot: Secondary | ICD-10-CM

## 2019-07-22 DIAGNOSIS — M2041 Other hammer toe(s) (acquired), right foot: Secondary | ICD-10-CM

## 2019-07-22 NOTE — Progress Notes (Signed)
Subjective:   Patient ID: Tyler Tate, male   DOB: 64 y.o.   MRN: [id number]   HPI Patient states doing really well stating it is feeling better and ready to have him removed   ROS      Objective:  Physical Exam  Neurovascular status intact negative Denna Haggard' sign noted second digit healing well wound edges well coapted pin in place doing     Assessment:  Well post pin removal second digit right     Plan:  H&P x-ray performed sterile dressing applied continue plantar flexion of digit reappoint 8 weeks final visit and slowly return to shoe gear at this time  X-rays indicate digits in good alignment toe is fused properly with good positional component

## 2019-08-08 ENCOUNTER — Other Ambulatory Visit: Payer: Self-pay | Admitting: Podiatry

## 2019-08-08 DIAGNOSIS — M2041 Other hammer toe(s) (acquired), right foot: Secondary | ICD-10-CM

## 2019-09-16 ENCOUNTER — Encounter: Payer: BC Managed Care – PPO | Admitting: Podiatry

## 2019-09-18 ENCOUNTER — Encounter: Payer: BC Managed Care – PPO | Admitting: Podiatry

## 2019-09-26 ENCOUNTER — Other Ambulatory Visit: Payer: Self-pay

## 2019-09-26 ENCOUNTER — Ambulatory Visit (INDEPENDENT_AMBULATORY_CARE_PROVIDER_SITE_OTHER): Payer: BC Managed Care – PPO | Admitting: Podiatry

## 2019-09-26 ENCOUNTER — Ambulatory Visit (INDEPENDENT_AMBULATORY_CARE_PROVIDER_SITE_OTHER): Payer: BC Managed Care – PPO

## 2019-09-26 ENCOUNTER — Encounter: Payer: Self-pay | Admitting: Podiatry

## 2019-09-26 VITALS — Temp 97.9°F

## 2019-09-26 DIAGNOSIS — M205X1 Other deformities of toe(s) (acquired), right foot: Secondary | ICD-10-CM

## 2019-09-26 DIAGNOSIS — M2042 Other hammer toe(s) (acquired), left foot: Secondary | ICD-10-CM

## 2019-09-26 DIAGNOSIS — M2041 Other hammer toe(s) (acquired), right foot: Secondary | ICD-10-CM

## 2019-09-26 NOTE — Progress Notes (Signed)
Subjective:   Patient ID: Tyler Tate, male   DOB: 64 y.o.   MRN: [id number]   HPI Patient presents I am doing well with my surgery   ROS      Objective:  Physical Exam  Neurovascular status intact negative Denna Haggard' sign noted patient's right second digit doing well with some elevation of the toe that does not bother him in the achieve her goal of reducing the pressure on the toe     Assessment:  Doing well post digital fusion digit to right     Plan:  X-ray reviewed patient discharged will be seen back as needed  X-rays indicate that the digit is in reasonably good alignment with mild elevation but nonpainful

## 2020-01-13 ENCOUNTER — Ambulatory Visit: Payer: BC Managed Care – PPO | Admitting: Podiatry

## 2020-01-31 ENCOUNTER — Ambulatory Visit (INDEPENDENT_AMBULATORY_CARE_PROVIDER_SITE_OTHER): Payer: Medicare Other | Admitting: Podiatry

## 2020-01-31 ENCOUNTER — Other Ambulatory Visit: Payer: Self-pay

## 2020-01-31 ENCOUNTER — Other Ambulatory Visit: Payer: Self-pay | Admitting: Podiatry

## 2020-01-31 ENCOUNTER — Ambulatory Visit (INDEPENDENT_AMBULATORY_CARE_PROVIDER_SITE_OTHER): Payer: Medicare Other

## 2020-01-31 DIAGNOSIS — M25572 Pain in left ankle and joints of left foot: Secondary | ICD-10-CM | POA: Diagnosis not present

## 2020-01-31 DIAGNOSIS — M7662 Achilles tendinitis, left leg: Secondary | ICD-10-CM

## 2020-01-31 DIAGNOSIS — M76822 Posterior tibial tendinitis, left leg: Secondary | ICD-10-CM | POA: Diagnosis not present

## 2020-01-31 DIAGNOSIS — Q666 Other congenital valgus deformities of feet: Secondary | ICD-10-CM

## 2020-02-03 ENCOUNTER — Encounter: Payer: Self-pay | Admitting: Podiatry

## 2020-02-03 NOTE — Progress Notes (Signed)
Subjective:  Patient ID: Tyler Tate, male    DOB: 02-27-1956,  MRN: [id number]  Chief Complaint  Patient presents with  . Ankle Pain    pt is here for left ankle pain medial side, pt states that the ankle pain is elevated to the touch. Pt states that it happened suddenly a couple of days ago. Pt states that the pain is also a soreness sensation, and is looking to get it looked at.    64 y.o. male presents with the above complaint.  Patient presents with a new complaint of left medial ankle pain.  Patient states is been hurting for about 3 days has progressive gotten worse.  Patient stated began as a soreness and then started turning into a lot of pain.  Patient states that he tends to put a lot of pressure to the foot.  Pain radiates up the leg.  Patient is looking to get injection in a boot.  He would like to discuss other treatment options available available.  He is known to Dr. Charlsie Merles for which he was treated in the past.  He denies any other acute complaints.   Review of Systems: Negative except as noted in the HPI. Denies N/V/F/Ch.  No past medical history on file.  Current Outpatient Medications:  .  amLODipine (NORVASC) 5 MG tablet, , Disp: , Rfl: 1 .  amoxicillin (AMOXIL) 500 MG capsule, TAKE FOUR CAPSULES BY MOUTH ONE HOUR BEFORE APPOINTMENT, Disp: , Rfl:  .  atorvastatin (LIPITOR) 40 MG tablet, , Disp: , Rfl:  .  diclofenac (VOLTAREN) 75 MG EC tablet, Take 1 tablet (75 mg total) by mouth 2 (two) times daily., Disp: 50 tablet, Rfl: 2 .  diclofenac Sodium (VOLTAREN) 1 % GEL, Apply topically., Disp: , Rfl:  .  glucosamine-chondroitin 500-400 MG tablet, Take 1 tablet by mouth daily., Disp: , Rfl:  .  hydrochlorothiazide (HYDRODIURIL) 25 MG tablet, Take 25 mg by mouth daily., Disp: , Rfl:  .  losartan (COZAAR) 100 MG tablet, Take 100 mg by mouth daily., Disp: , Rfl:  .  losartan-hydrochlorothiazide (HYZAAR) 100-25 MG tablet, , Disp: , Rfl:  .  metoprolol succinate (TOPROL-XL) 50  MG 24 hr tablet, , Disp: , Rfl:  .  Multiple Vitamins-Minerals (MULTIVITAMIN ADULT PO), Take 1 tablet by mouth daily., Disp: , Rfl:  .  mupirocin ointment (BACTROBAN) 2 %, APPLY OINTMENT TOPICALLY TO AFFECTED AREA TWICE DAILY, Disp: , Rfl:  .  naproxen (NAPROSYN) 500 MG tablet, Take 500 mg by mouth 2 (two) times daily., Disp: , Rfl:  .  Omega-3 Fatty Acids (FISH OIL CONCENTRATE PO), Take 1 tablet by mouth daily., Disp: , Rfl:  .  omeprazole (PRILOSEC) 20 MG capsule, Take 20 mg by mouth daily., Disp: , Rfl:  .  venlafaxine XR (EFFEXOR-XR) 150 MG 24 hr capsule, TK ONE C PO ONCE A DAY, Disp: , Rfl: 2  Social History   Tobacco Use  Smoking Status Current Some Day Smoker  . Types: Cigars  Smokeless Tobacco Never Used    Allergies  Allergen Reactions  . Penicillin G Rash   Objective:  There were no vitals filed for this visit. There is no height or weight on file to calculate BMI. Constitutional Well developed. Well nourished.  Vascular Dorsalis pedis pulses palpable bilaterally. Posterior tibial pulses palpable bilaterally. Capillary refill normal to all digits.  No cyanosis or clubbing noted. Pedal hair growth normal.  Neurologic Normal speech. Oriented to person, place, and time. Epicritic sensation to light  touch grossly present bilaterally.  Dermatologic Nails well groomed and normal in appearance. No open wounds. No skin lesions.  Orthopedic:  Pain on palpation along the course of the posterior tibial tendon including the insertion.  Pain with resisted plantarflexion inversion active and passive.  Pain with plantarflexion inversion of the foot without resistance.  No pain at the Achilles tendon, peroneal tendon, Achilles tendon.   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  Mild navicular exostosis noted at the insertion of posterior tibial tendon.  No other bony abnormalities identified. Assessment:   1. Left ankle  pain, unspecified chronicity    Plan:  Patient was evaluated and treated and all questions answered.  Left posterior tibial tendinitis secondary to underlying pes planovalgus -I explained to patient the etiology of tendinitis and various treatment options were extensively discussed.  This is likely attributed to the underlying pes planovalgus deformity without proper shoe gear as well as orthotics.  I discussed with the patient that he will benefit from a steroid injection at the point of maximal tenderness and there is a risk of tendon rupture associated with it.  Patient states the understanding of the risk and would like to proceed with injection. -A steroid injection was performed at left medial foot a point of maximal tenderness using 1% plain Lidocaine and 10 mg of Kenalog. This was well tolerated. -He will also be placed in a cam boot for immobilization allow the tendon to heal properly.   No follow-ups on file.

## 2020-02-04 ENCOUNTER — Ambulatory Visit (INDEPENDENT_AMBULATORY_CARE_PROVIDER_SITE_OTHER): Payer: Medicare Other | Admitting: Podiatry

## 2020-02-04 ENCOUNTER — Other Ambulatory Visit: Payer: Self-pay

## 2020-02-04 ENCOUNTER — Encounter: Payer: Self-pay | Admitting: Podiatry

## 2020-02-04 DIAGNOSIS — M7752 Other enthesopathy of left foot: Secondary | ICD-10-CM

## 2020-02-04 DIAGNOSIS — M76822 Posterior tibial tendinitis, left leg: Secondary | ICD-10-CM

## 2020-02-05 ENCOUNTER — Encounter: Payer: Self-pay | Admitting: Podiatry

## 2020-02-05 NOTE — Progress Notes (Signed)
Subjective:  Patient ID: Tyler Tate, male    DOB: [birth year],  MRN: [id number]  Chief Complaint  Patient presents with  . Foot Pain    "the back of my heel is killing me.  It started about 2 days ago and I don't know if the boot aggrivated it or not.   Its very painful to walk"   He has been using ice and taking Advil for relief    64 y.o. male presents with the above complaint.  Patient presents with a new complaint of left posterior heel retrocalcaneal bursitis.  Patient states it started but 2 days ago it got aggravated by the boot has been very painful to walk on.  Patient was being treated for posterior tibial tendinitis by me for which she was seen last week for which he received injection in cam boot immobilization.  I believe the boot may have caused the back of the heel to be aggravated and therefore leading to a bursitis.  He denies any other acute complaints he denies seeing anyone else prior to seeing me.   Review of Systems: Negative except as noted in the HPI. Denies N/V/F/Ch.  No past medical history on file.  Current Outpatient Medications:  .  amLODipine (NORVASC) 5 MG tablet, , Disp: , Rfl: 1 .  amoxicillin (AMOXIL) 500 MG capsule, TAKE FOUR CAPSULES BY MOUTH ONE HOUR BEFORE APPOINTMENT, Disp: , Rfl:  .  atorvastatin (LIPITOR) 40 MG tablet, , Disp: , Rfl:  .  diclofenac (VOLTAREN) 75 MG EC tablet, Take 1 tablet (75 mg total) by mouth 2 (two) times daily., Disp: 50 tablet, Rfl: 2 .  diclofenac Sodium (VOLTAREN) 1 % GEL, Apply topically., Disp: , Rfl:  .  glucosamine-chondroitin 500-400 MG tablet, Take 1 tablet by mouth daily., Disp: , Rfl:  .  hydrochlorothiazide (HYDRODIURIL) 25 MG tablet, Take 25 mg by mouth daily., Disp: , Rfl:  .  losartan (COZAAR) 100 MG tablet, Take 100 mg by mouth daily., Disp: , Rfl:  .  losartan-hydrochlorothiazide (HYZAAR) 100-25 MG tablet, , Disp: , Rfl:  .  metoprolol succinate (TOPROL-XL) 50 MG 24 hr tablet, , Disp: , Rfl:  .  Multiple  Vitamins-Minerals (MULTIVITAMIN ADULT PO), Take 1 tablet by mouth daily., Disp: , Rfl:  .  mupirocin ointment (BACTROBAN) 2 %, APPLY OINTMENT TOPICALLY TO AFFECTED AREA TWICE DAILY, Disp: , Rfl:  .  naproxen (NAPROSYN) 500 MG tablet, Take 500 mg by mouth 2 (two) times daily., Disp: , Rfl:  .  Omega-3 Fatty Acids (FISH OIL CONCENTRATE PO), Take 1 tablet by mouth daily., Disp: , Rfl:  .  omeprazole (PRILOSEC) 20 MG capsule, Take 20 mg by mouth daily., Disp: , Rfl:  .  venlafaxine XR (EFFEXOR-XR) 150 MG 24 hr capsule, TK ONE C PO ONCE A DAY, Disp: , Rfl: 2  Social History   Tobacco Use  Smoking Status Current Some Day Smoker  . Types: Cigars  Smokeless Tobacco Never Used    Allergies  Allergen Reactions  . Penicillin G Rash   Objective:  There were no vitals filed for this visit. There is no height or weight on file to calculate BMI. Constitutional Well developed. Well nourished.  Vascular Dorsalis pedis pulses palpable bilaterally. Posterior tibial pulses palpable bilaterally. Capillary refill normal to all digits.  No cyanosis or clubbing noted. Pedal hair growth normal.  Neurologic Normal speech. Oriented to person, place, and time. Epicritic sensation to light touch grossly present bilaterally.  Dermatologic Nails well groomed and  normal in appearance. No open wounds. No skin lesions.  Orthopedic:  Pain on palpation along the course of the posterior tibial tendon including the insertion.  Pain with resisted plantarflexion inversion active and passive.  Pain with plantarflexion inversion of the foot without resistance.  No pain at the Achilles tendon, peroneal tendon, Achilles tendon.  Left posterior heel bursitis with palpable soft tissue mass noted to the posterior aspect of the heel.  There is mild erythema warmth present to it.  No fluctuance noted.   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  Mild navicular  exostosis noted at the insertion of posterior tibial tendon.  No other bony abnormalities identified. Assessment:   1. Bursitis of left ankle   2. Posterior tibial tendinitis, left    Plan:  Patient was evaluated and treated and all questions answered.  Left posterior heel bursitis -I explained to the patient the etiology of gastritis and various treatment options were discussed.  I discussed with the patient to continue wearing cam boot but with padding and make sure that he is not pistoning in and out of the boot.  Patient states understanding and will wear the boot and place it on tight.  I instructed him to not ambulate a lot on the foot while is healing up.  Patient states understanding.  I believe he will also benefit from a steroid injection to help decrease acute inflammatory component associated with pain.  Patient states understanding. -A steroid injection was performed at left posterior heel at the point of maximal tenderness using 1% plain Lidocaine and 10 mg of Kenalog. This was well tolerated.   Left posterior tibial tendinitis secondary to underlying pes planovalgus -I explained to patient the etiology of tendinitis and various treatment options were extensively discussed.  This is likely attributed to the underlying pes planovalgus deformity without proper shoe gear as well as orthotics.  I discussed with the patient that he will benefit from a steroid injection at the point of maximal tenderness and there is a risk of tendon rupture associated with it.  Patient states the understanding of the risk and would like to proceed with injection. -A steroid injection was performed at left medial foot a point of maximal tenderness using 1% plain Lidocaine and 10 mg of Kenalog. This was well tolerated. -He will also be placed in a cam boot for immobilization allow the tendon to heal properly.   No follow-ups on file.

## 2020-02-28 ENCOUNTER — Ambulatory Visit: Payer: Medicare Other | Admitting: Podiatry

## 2020-06-10 ENCOUNTER — Other Ambulatory Visit: Payer: Self-pay | Admitting: Orthopedic Surgery

## 2020-06-10 DIAGNOSIS — R52 Pain, unspecified: Secondary | ICD-10-CM

## 2020-07-10 ENCOUNTER — Ambulatory Visit
Admission: RE | Admit: 2020-07-10 | Discharge: 2020-07-10 | Disposition: A | Discharge status: Home / Self Care | Payer: Medicare Other | Source: Ambulatory Visit | Attending: Orthopedic Surgery | Admitting: Orthopedic Surgery

## 2020-07-10 ENCOUNTER — Other Ambulatory Visit: Payer: Self-pay

## 2020-07-10 DIAGNOSIS — R52 Pain, unspecified: Secondary | ICD-10-CM

## 2020-10-29 ENCOUNTER — Other Ambulatory Visit: Payer: Self-pay

## 2020-10-29 ENCOUNTER — Encounter: Payer: Self-pay | Admitting: Podiatry

## 2020-10-29 ENCOUNTER — Ambulatory Visit (INDEPENDENT_AMBULATORY_CARE_PROVIDER_SITE_OTHER): Payer: Medicare Other | Admitting: Podiatry

## 2020-10-29 ENCOUNTER — Ambulatory Visit: Payer: Medicare Other

## 2020-10-29 DIAGNOSIS — M109 Gout, unspecified: Secondary | ICD-10-CM

## 2020-10-29 DIAGNOSIS — M76822 Posterior tibial tendinitis, left leg: Secondary | ICD-10-CM | POA: Diagnosis not present

## 2020-10-29 DIAGNOSIS — M778 Other enthesopathies, not elsewhere classified: Secondary | ICD-10-CM | POA: Diagnosis not present

## 2020-10-29 MED ORDER — TRIAMCINOLONE ACETONIDE 10 MG/ML IJ SUSP
10.0000 mg | Freq: Once | INTRAMUSCULAR | Status: AC
  Administered 2020-10-29: 10 mg

## 2020-10-29 NOTE — Patient Instructions (Signed)
Gout  Gout is painful swelling of your joints. Gout is a type of arthritis. It is caused by having too much uric acid in your body. Uric acid is a chemical that is made when your body breaks down substances called purines. If your body has too much uric acid, sharp crystals can form and build up in your joints. This causes pain and swelling. Gout attacks can happen quickly and be very painful (acute gout). Over time, the attacks can affect more joints and happen more often (chronic gout). What are the causes?  Too much uric acid in your blood. This can happen because: ? Your kidneys do not remove enough uric acid from your blood. ? Your body makes too much uric acid. ? You eat too many foods that are high in purines. These foods include organ meats, some seafood, and beer.  Trauma or stress. What increases the risk?  Having a family history of gout.  Being male and middle-aged.  Being male and having gone through menopause.  Being very overweight (obese).  Drinking alcohol, especially beer.  Not having enough water in the body (being dehydrated).  Losing weight too quickly.  Having an organ transplant.  Having lead poisoning.  Taking certain medicines.  Having kidney disease.  Having a skin condition called psoriasis. What are the signs or symptoms? An attack of acute gout usually happens in just one joint. The most common place is the big toe. Attacks often start at night. Other joints that may be affected include joints of the feet, ankle, knee, fingers, wrist, or elbow. Symptoms of an attack may include:  Very bad pain.  Warmth.  Swelling.  Stiffness.  Shiny, red, or purple skin.  Tenderness. The affected joint may be very painful to touch.  Chills and fever. Chronic gout may cause symptoms more often. More joints may be involved. You may also have white or yellow lumps (tophi) on your hands or feet or in other areas near your joints.   How is this  treated?  Treatment for this condition has two phases: treating an acute attack and preventing future attacks.  Acute gout treatment may include: ? NSAIDs. ? Steroids. These are taken by mouth or injected into a joint. ? Colchicine. This medicine relieves pain and swelling. It can be given by mouth or through an IV tube.  Preventive treatment may include: ? Taking small doses of NSAIDs or colchicine daily. ? Using a medicine that reduces uric acid levels in your blood. ? Making changes to your diet. You may need to see a food expert (dietitian) about what to eat and drink to prevent gout. Follow these instructions at home: During a gout attack  If told, put ice on the painful area: ? Put ice in a plastic bag. ? Place a towel between your skin and the bag. ? Leave the ice on for 20 minutes, 2-3 times a day.  Raise (elevate) the painful joint above the level of your heart as often as you can.  Rest the joint as much as possible. If the joint is in your leg, you may be given crutches.  Follow instructions from your doctor about what you cannot eat or drink.   Avoiding future gout attacks  Eat a low-purine diet. Avoid foods and drinks such as: ? Liver. ? Kidney. ? Anchovies. ? Asparagus. ? Herring. ? Mushrooms. ? Mussels. ? Beer.  Stay at a healthy weight. If you want to lose weight, talk with your doctor. Do   not lose weight too fast.  Start or continue an exercise plan as told by your doctor. Eating and drinking  Drink enough fluids to keep your pee (urine) pale yellow.  If you drink alcohol: ? Limit how much you use to:  0-1 drink a day for women.  0-2 drinks a day for men. ? Be aware of how much alcohol is in your drink. In the [place] one drink equals one 12 oz bottle of beer (355 mL), one 5 oz glass of wine (148 mL), or one 1 oz glass of hard liquor (44 mL). General instructions  Take over-the-counter and prescription medicines only as told by your doctor.  Do  not drive or use heavy machinery while taking prescription pain medicine.  Return to your normal activities as told by your doctor. Ask your doctor what activities are safe for you.  Keep all follow-up visits as told by your doctor. This is important. Contact a doctor if:  You have another gout attack.  You still have symptoms of a gout attack after 10 days of treatment.  You have problems (side effects) because of your medicines.  You have chills or a fever.  You have burning pain when you pee (urinate).  You have pain in your lower back or belly. Get help right away if:  You have very bad pain.  Your pain cannot be controlled.  You cannot pee. Summary  Gout is painful swelling of the joints.  The most common site of pain is the big toe, but it can affect other joints.  Medicines and avoiding some foods can help to prevent and treat gout attacks. This information is not intended to replace advice given to you by your health care provider. Make sure you discuss any questions you have with your health care provider. Document Revised: 01/03/2018 Document Reviewed: 01/03/2018 Elsevier Patient Education  2021 [employer]  

## 2020-10-31 LAB — RHEUMATOID ARTHRITIS PROFILE
Cyclic Citrullin Peptide Ab: 1 units (ref 0–19)
Rheumatoid fact SerPl-aCnc: 11.9 IU/mL (ref <14.0)

## 2020-11-02 NOTE — Progress Notes (Signed)
Subjective:   Patient ID: Tyler Tate, male   DOB: 65 y.o.   MRN: [id number]   HPI Patient presents with several areas of pain on the left foot and also states he has had periodic where he gets swelling and redness in his foot.  Patient points to the left foot states its been severe for the last several days and he was trouble walking on it   ROS      Objective:  Physical Exam  Neurovascular status intact negative Denna Haggard' sign noted with quite a bit of redness and edema in the dorsum of the left foot mild discomfort in the medial side of the left foot not to the same degree with moderate flatfoot deformity     Assessment:  Probability with extensor tendinitis left inflammation mild posterior tibial tendinitis left and given his long-term history of these inflammatory processes possibility for gout     Plan:  H&P reviewed all different conditions and discussed with him the differences between them.  At this point I do think were dealing with acute inflammation but I also think gout is possible and I did educate him on gout and gave him sheets to discuss foods to be careful out and discussed medications with him today.  We will get a focus on the acute inflammation I did sterile prep and injected the dorsal extensor tendon complex 3 mg dexamethasone Kenalog 5 mg Xylocaine and advised on heat ice therapy.  Reappoint to recheck and I also ordered a rheumatoid profile to rule out any form of arthritides or gout associated with the inflammation  X-rays indicate there is no indications of acute fracture or acute arthritis with moderate swelling of the dorsal soft tissue complex

## 2020-11-05 ENCOUNTER — Encounter: Payer: Self-pay | Admitting: Podiatry

## 2020-11-05 ENCOUNTER — Ambulatory Visit (INDEPENDENT_AMBULATORY_CARE_PROVIDER_SITE_OTHER): Payer: Medicare Other | Admitting: Podiatry

## 2020-11-05 ENCOUNTER — Other Ambulatory Visit: Payer: Self-pay

## 2020-11-05 DIAGNOSIS — M778 Other enthesopathies, not elsewhere classified: Secondary | ICD-10-CM

## 2020-11-05 MED ORDER — TRIAMCINOLONE ACETONIDE 10 MG/ML IJ SUSP
10.0000 mg | Freq: Once | INTRAMUSCULAR | Status: AC
  Administered 2020-11-05: 10 mg

## 2020-11-05 NOTE — Progress Notes (Signed)
Subjective:   Patient ID: Tyler Tate, male   DOB: 65 y.o.   MRN: [id number]   HPI Patient states the pain on top of his left foot and left great toe has resolved but he is getting some inflammation around the right first metatarsal head now neuro   ROS      Objective:  Physical Exam  Neurovascular status intact with improvement left which was most likely gout even though we did get rheumatoid factor but unfortunately did not receive uric acid testing and there is inflammation now around the first MPJ right with also prominent     Assessment:  Doing well with left foot from acute inflammation with moderate discomfort right which may be more inflammatory capsulitis versus gout as it does occur and 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  May require uric acid testing and may ultimately require surgery

## 2020-11-12 ENCOUNTER — Inpatient Hospital Stay (HOSPITAL_COMMUNITY): Payer: Medicare Other

## 2020-11-12 ENCOUNTER — Emergency Department (HOSPITAL_COMMUNITY): Payer: Medicare Other

## 2020-11-12 ENCOUNTER — Encounter (HOSPITAL_COMMUNITY): Payer: Self-pay

## 2020-11-12 ENCOUNTER — Inpatient Hospital Stay (HOSPITAL_COMMUNITY)
Admission: EM | Admit: 2020-11-12 | Discharge: 2020-11-16 | DRG: 536 | Disposition: A | Discharge status: Home Health | Payer: Medicare Other | Attending: Internal Medicine | Admitting: Internal Medicine

## 2020-11-12 DIAGNOSIS — R0682 Tachypnea, not elsewhere classified: Secondary | ICD-10-CM | POA: Diagnosis present

## 2020-11-12 DIAGNOSIS — Z880 Allergy status to penicillin: Secondary | ICD-10-CM

## 2020-11-12 DIAGNOSIS — K219 Gastro-esophageal reflux disease without esophagitis: Secondary | ICD-10-CM | POA: Diagnosis present

## 2020-11-12 DIAGNOSIS — Y92009 Unspecified place in unspecified non-institutional (private) residence as the place of occurrence of the external cause: Secondary | ICD-10-CM

## 2020-11-12 DIAGNOSIS — Z96643 Presence of artificial hip joint, bilateral: Secondary | ICD-10-CM | POA: Diagnosis present

## 2020-11-12 DIAGNOSIS — F1010 Alcohol abuse, uncomplicated: Secondary | ICD-10-CM | POA: Diagnosis present

## 2020-11-12 DIAGNOSIS — Z20822 Contact with and (suspected) exposure to covid-19: Secondary | ICD-10-CM | POA: Diagnosis present

## 2020-11-12 DIAGNOSIS — Z789 Other specified health status: Secondary | ICD-10-CM

## 2020-11-12 DIAGNOSIS — Z79899 Other long term (current) drug therapy: Secondary | ICD-10-CM | POA: Diagnosis not present

## 2020-11-12 DIAGNOSIS — S72009A Fracture of unspecified part of neck of unspecified femur, initial encounter for closed fracture: Secondary | ICD-10-CM | POA: Diagnosis present

## 2020-11-12 DIAGNOSIS — S72144A Nondisplaced intertrochanteric fracture of right femur, initial encounter for closed fracture: Principal | ICD-10-CM | POA: Diagnosis present

## 2020-11-12 DIAGNOSIS — W19XXXA Unspecified fall, initial encounter: Secondary | ICD-10-CM | POA: Diagnosis not present

## 2020-11-12 DIAGNOSIS — M978XXA Periprosthetic fracture around other internal prosthetic joint, initial encounter: Secondary | ICD-10-CM

## 2020-11-12 DIAGNOSIS — W010XXA Fall on same level from slipping, tripping and stumbling without subsequent striking against object, initial encounter: Secondary | ICD-10-CM | POA: Diagnosis present

## 2020-11-12 DIAGNOSIS — S7224XA Nondisplaced subtrochanteric fracture of right femur, initial encounter for closed fracture: Secondary | ICD-10-CM | POA: Diagnosis present

## 2020-11-12 DIAGNOSIS — M9701XA Periprosthetic fracture around internal prosthetic right hip joint, initial encounter: Secondary | ICD-10-CM | POA: Diagnosis not present

## 2020-11-12 DIAGNOSIS — I10 Essential (primary) hypertension: Secondary | ICD-10-CM | POA: Diagnosis present

## 2020-11-12 DIAGNOSIS — S72001A Fracture of unspecified part of neck of right femur, initial encounter for closed fracture: Secondary | ICD-10-CM | POA: Diagnosis not present

## 2020-11-12 DIAGNOSIS — F17290 Nicotine dependence, other tobacco product, uncomplicated: Secondary | ICD-10-CM | POA: Diagnosis present

## 2020-11-12 DIAGNOSIS — D649 Anemia, unspecified: Secondary | ICD-10-CM | POA: Diagnosis present

## 2020-11-12 DIAGNOSIS — D72829 Elevated white blood cell count, unspecified: Secondary | ICD-10-CM | POA: Diagnosis present

## 2020-11-12 DIAGNOSIS — Z792 Long term (current) use of antibiotics: Secondary | ICD-10-CM

## 2020-11-12 DIAGNOSIS — Z7289 Other problems related to lifestyle: Secondary | ICD-10-CM | POA: Diagnosis not present

## 2020-11-12 DIAGNOSIS — Y92019 Unspecified place in single-family (private) house as the place of occurrence of the external cause: Secondary | ICD-10-CM | POA: Diagnosis not present

## 2020-11-12 DIAGNOSIS — E785 Hyperlipidemia, unspecified: Secondary | ICD-10-CM | POA: Diagnosis present

## 2020-11-12 DIAGNOSIS — Z96649 Presence of unspecified artificial hip joint: Secondary | ICD-10-CM

## 2020-11-12 HISTORY — DX: Essential (primary) hypertension: I10

## 2020-11-12 LAB — BASIC METABOLIC PANEL
Anion gap: 10 (ref 5–15)
BUN: 26 mg/dL — ABNORMAL HIGH (ref 8–23)
CO2: 22 mmol/L (ref 22–32)
Calcium: 8.8 mg/dL — ABNORMAL LOW (ref 8.9–10.3)
Chloride: 106 mmol/L (ref 98–111)
Creatinine, Ser: 1.18 mg/dL (ref 0.61–1.24)
GFR, Estimated: >60 mL/min (ref >60)
Glucose, Bld: 95 mg/dL (ref 70–99)
Potassium: 4.2 mmol/L (ref 3.5–5.1)
Sodium: 138 mmol/L (ref 135–145)

## 2020-11-12 LAB — RESP PANEL BY RT-PCR (FLU A&B, COVID) ARPGX2
Influenza A by PCR: NEGATIVE
Influenza B by PCR: NEGATIVE
SARS Coronavirus 2 by RT PCR: NEGATIVE

## 2020-11-12 LAB — CBC WITH DIFFERENTIAL/PLATELET
Abs Immature Granulocytes: 0.11 10*3/uL — ABNORMAL HIGH (ref 0.00–0.07)
Basophils Absolute: 0.1 10*3/uL (ref 0.0–0.1)
Basophils Relative: 1 %
Eosinophils Absolute: 0.1 10*3/uL (ref 0.0–0.5)
Eosinophils Relative: 1 %
HCT: 38.4 % — ABNORMAL LOW (ref 39.0–52.0)
Hemoglobin: 12.9 g/dL — ABNORMAL LOW (ref 13.0–17.0)
Immature Granulocytes: 1 %
Lymphocytes Relative: 17 %
Lymphs Abs: 2.3 10*3/uL (ref 0.7–4.0)
MCH: 31.7 pg (ref 26.0–34.0)
MCHC: 33.6 g/dL (ref 30.0–36.0)
MCV: 94.3 fL (ref 80.0–100.0)
Monocytes Absolute: 1.6 10*3/uL — ABNORMAL HIGH (ref 0.1–1.0)
Monocytes Relative: 12 %
Neutro Abs: 9.5 10*3/uL — ABNORMAL HIGH (ref 1.7–7.7)
Neutrophils Relative %: 68 %
Platelets: 289 10*3/uL (ref 150–400)
RBC: 4.07 MIL/uL — ABNORMAL LOW (ref 4.22–5.81)
RDW: 12.5 % (ref 11.5–15.5)
WBC: 13.8 10*3/uL — ABNORMAL HIGH (ref 4.0–10.5)
nRBC: 0 % (ref 0.0–0.2)

## 2020-11-12 LAB — SURGICAL PCR SCREEN
MRSA, PCR: NEGATIVE
Staphylococcus aureus: NEGATIVE

## 2020-11-12 LAB — HIV ANTIBODY (ROUTINE TESTING W REFLEX): HIV Screen 4th Generation wRfx: NONREACTIVE

## 2020-11-12 MED ORDER — ATORVASTATIN CALCIUM 40 MG PO TABS
40.0000 mg | ORAL_TABLET | Freq: Every day | ORAL | Status: DC
  Administered 2020-11-13 – 2020-11-16 (×4): 40 mg via ORAL
  Filled 2020-11-12 (×4): qty 1

## 2020-11-12 MED ORDER — MORPHINE SULFATE (PF) 4 MG/ML IV SOLN
4.0000 mg | Freq: Once | INTRAVENOUS | Status: AC
  Administered 2020-11-12: 4 mg via INTRAVENOUS
  Filled 2020-11-12: qty 1

## 2020-11-12 MED ORDER — SODIUM CHLORIDE 0.9 % IV SOLN: Freq: Once | INTRAVENOUS | Status: AC

## 2020-11-12 MED ORDER — AMLODIPINE BESYLATE 5 MG PO TABS
5.0000 mg | ORAL_TABLET | Freq: Every day | ORAL | Status: DC
  Administered 2020-11-13 – 2020-11-16 (×4): 5 mg via ORAL
  Filled 2020-11-12 (×4): qty 1

## 2020-11-12 MED ORDER — SENNOSIDES-DOCUSATE SODIUM 8.6-50 MG PO TABS
1.0000 | ORAL_TABLET | Freq: Two times a day (BID) | ORAL | Status: DC
  Administered 2020-11-13 – 2020-11-16 (×5): 1 via ORAL
  Filled 2020-11-12 (×7): qty 1

## 2020-11-12 MED ORDER — ENOXAPARIN SODIUM 40 MG/0.4ML IJ SOSY
40.0000 mg | PREFILLED_SYRINGE | INTRAMUSCULAR | Status: DC
  Administered 2020-11-12: 40 mg via SUBCUTANEOUS
  Filled 2020-11-12: qty 0.4

## 2020-11-12 MED ORDER — HYDROCHLOROTHIAZIDE 25 MG PO TABS
25.0000 mg | ORAL_TABLET | Freq: Every day | ORAL | Status: DC
  Administered 2020-11-13 – 2020-11-16 (×4): 25 mg via ORAL
  Filled 2020-11-12 (×4): qty 1

## 2020-11-12 MED ORDER — METOPROLOL SUCCINATE ER 50 MG PO TB24
50.0000 mg | ORAL_TABLET | Freq: Every day | ORAL | Status: DC
  Administered 2020-11-13 – 2020-11-16 (×4): 50 mg via ORAL
  Filled 2020-11-12 (×4): qty 1

## 2020-11-12 MED ORDER — LORAZEPAM 1 MG PO TABS
1.0000 mg | ORAL_TABLET | ORAL | Status: AC | PRN
  Filled 2020-11-12: qty 2

## 2020-11-12 MED ORDER — HYDROCODONE-ACETAMINOPHEN 5-325 MG PO TABS
1.0000 | ORAL_TABLET | Freq: Four times a day (QID) | ORAL | Status: DC | PRN
  Administered 2020-11-12 – 2020-11-16 (×7): 2 via ORAL
  Filled 2020-11-12 (×7): qty 2

## 2020-11-12 MED ORDER — LORAZEPAM 2 MG/ML IJ SOLN
0.0000 mg | Freq: Four times a day (QID) | INTRAMUSCULAR | Status: AC
  Administered 2020-11-12: 2 mg via INTRAVENOUS
  Filled 2020-11-12: qty 1

## 2020-11-12 MED ORDER — LOSARTAN POTASSIUM 50 MG PO TABS
100.0000 mg | ORAL_TABLET | Freq: Every day | ORAL | Status: DC
  Administered 2020-11-13 – 2020-11-14 (×2): 100 mg via ORAL
  Filled 2020-11-12 (×2): qty 2

## 2020-11-12 MED ORDER — FOLIC ACID 1 MG PO TABS
1.0000 mg | ORAL_TABLET | Freq: Every day | ORAL | Status: DC
  Administered 2020-11-12 – 2020-11-16 (×5): 1 mg via ORAL
  Filled 2020-11-12 (×5): qty 1

## 2020-11-12 MED ORDER — LORAZEPAM 2 MG/ML IJ SOLN
1.0000 mg | INTRAMUSCULAR | Status: AC | PRN
Start: 2020-11-12 — End: 2020-11-15

## 2020-11-12 MED ORDER — VENLAFAXINE HCL ER 150 MG PO CP24
150.0000 mg | ORAL_CAPSULE | Freq: Every day | ORAL | Status: DC
  Administered 2020-11-13 – 2020-11-16 (×4): 150 mg via ORAL
  Filled 2020-11-12 (×4): qty 1

## 2020-11-12 MED ORDER — THIAMINE HCL 100 MG/ML IJ SOLN
100.0000 mg | Freq: Every day | INTRAMUSCULAR | Status: DC
  Filled 2020-11-12: qty 2

## 2020-11-12 MED ORDER — THIAMINE HCL 100 MG PO TABS
100.0000 mg | ORAL_TABLET | Freq: Every day | ORAL | Status: DC
  Administered 2020-11-12 – 2020-11-16 (×5): 100 mg via ORAL
  Filled 2020-11-12 (×5): qty 1

## 2020-11-12 MED ORDER — LORAZEPAM 2 MG/ML IJ SOLN
0.0000 mg | Freq: Two times a day (BID) | INTRAMUSCULAR | Status: DC
  Filled 2020-11-12: qty 1

## 2020-11-12 MED ORDER — MORPHINE SULFATE (PF) 2 MG/ML IV SOLN
0.5000 mg | INTRAVENOUS | Status: DC | PRN
  Administered 2020-11-12 – 2020-11-14 (×9): 0.5 mg via INTRAVENOUS
  Filled 2020-11-12 (×9): qty 1

## 2020-11-12 MED ORDER — ADULT MULTIVITAMIN W/MINERALS CH
1.0000 | ORAL_TABLET | Freq: Every day | ORAL | Status: DC
  Administered 2020-11-12 – 2020-11-16 (×5): 1 via ORAL
  Filled 2020-11-12 (×5): qty 1

## 2020-11-12 MED ORDER — LOSARTAN POTASSIUM-HCTZ 100-25 MG PO TABS: 1.0000 | ORAL_TABLET | Freq: Every day | ORAL | Status: DC

## 2020-11-12 NOTE — ED Triage Notes (Signed)
Trip and fall last night onto right hip. Hip replacement in 2007. Unable to bear weight this am with pain 10/10.

## 2020-11-12 NOTE — ED Provider Notes (Signed)
[facility] EMERGENCY DEPARTMENT Provider Note   CSN: [id number] Arrival date & time: 11/12/20  7253     History Chief Complaint  Patient presents with  . Hip Pain    Tyler Tate is a 65 y.o. male history of bilateral hip replacements presents today for right hip pain after a fall that occurred last night.  Patient tripped over a piece of furniture falling directly onto the right hip reports immediate right hip pain aching constant worse with movement improves with rest pain does not radiate.  Patient feels that his hip is intermittently being "dislocated".  Patient was able to get up off the ground by himself yesterday and returned to bed, he reports upon waking up this morning pain had increased and he called EMS.  Denies head injury, loss consciousness, blood thinner use, neck pain, back pain, chest pain, abdominal pain, numbness/tingling, weakness or any additional concerns.  HPI     Past Medical History:  Diagnosis Date  . Hypertension     Patient Active Problem List   Diagnosis Date Noted  . Hip fracture (HCC) 11/12/2020    Past Surgical History:  Procedure Laterality Date  . JOINT REPLACEMENT     hip right and left       No family history on file.  Social History   Tobacco Use  . Smoking status: Current Some Day Smoker    Types: Cigars  . Smokeless tobacco: Never Used  Vaping Use  . Vaping Use: Never used  Substance Use Topics  . Alcohol use: Not Currently    Alcohol/week: 0.0 standard drinks  . Drug use: Never    Home Medications Prior to Admission medications   Medication Sig Start Date End Date Taking? Authorizing Provider  amLODipine (NORVASC) 5 MG tablet Take 5 mg by mouth daily. 09/11/15  Yes [provider]  amoxicillin (AMOXIL) 500 MG capsule Take 2,000 mg by mouth as directed. For dental appt. 05/13/19  Yes [provider]  atorvastatin (LIPITOR) 40 MG tablet Take 40 mg by mouth daily. 05/01/15  Yes  [provider]  glucosamine-chondroitin 500-400 MG tablet Take 1 tablet by mouth daily.   Yes [provider]  ibuprofen (ADVIL) 200 MG tablet Take 800 mg by mouth every 6 (six) hours as needed for moderate pain.   Yes [provider]  losartan-hydrochlorothiazide (HYZAAR) 100-25 MG tablet Take 1 tablet by mouth daily. 05/10/15  Yes [provider]  metoprolol succinate (TOPROL-XL) 50 MG 24 hr tablet Take 50 mg by mouth daily. 05/01/15  Yes [provider]  Multiple Vitamins-Minerals (MULTIVITAMIN ADULT PO) Take 1 tablet by mouth daily.   Yes [provider]  omeprazole (PRILOSEC) 20 MG capsule Take 20 mg by mouth every other day.   Yes [provider]  venlafaxine XR (EFFEXOR-XR) 150 MG 24 hr capsule Take 150 mg by mouth daily with breakfast. 08/19/15  Yes [provider]    Allergies    Penicillin g  Review of Systems   Review of Systems Ten systems are reviewed and are negative for acute change except as noted in the HPI  Physical Exam Updated Vital Signs BP (!) 144/80 (BP Location: Right Arm)   Pulse 79   Temp 98.8 F (37.1 C) (Oral)   Resp (!) 22   Ht 6\' 1"  (1.854 m)   Wt 95.3 kg   SpO2 95%   BMI 27.71 kg/m   Physical Exam Constitutional:      General: He  is not in acute distress.    Appearance: Normal appearance. He is well-developed. He is not ill-appearing or diaphoretic.  HENT:     Head: Normocephalic and atraumatic.  Eyes:     General: Vision grossly intact. Gaze aligned appropriately.     Pupils: Pupils are equal, round, and reactive to light.  Neck:     Trachea: Trachea and phonation normal.  Cardiovascular:     Rate and Rhythm: Normal rate and regular rhythm.     Pulses:          Dorsalis pedis pulses are 2+ on the right side and 2+ on the left side.  Pulmonary:     Effort: Pulmonary effort is normal. No respiratory distress.  Abdominal:     General: There is no distension.      Palpations: Abdomen is soft.     Tenderness: There is no abdominal tenderness. There is no guarding or rebound.  Musculoskeletal:        General: Normal range of motion.     Cervical back: Normal range of motion.     Comments: TTP right hip.  No leg shortening.  Pelvis stable to compression without pain bilaterally.  No TTP of the knee lower leg foot or ankle.  Feet:     Right foot:     Protective Sensation: 3 sites tested. 3 sites sensed.     Left foot:     Protective Sensation: 3 sites tested. 3 sites sensed.  Skin:    General: Skin is warm and dry.  Neurological:     Mental Status: He is alert.     GCS: GCS eye subscore is 4. GCS verbal subscore is 5. GCS motor subscore is 6.     Comments: Speech is clear and goal oriented, follows commands Major Cranial nerves without deficit, no facial droop Moves extremities without ataxia, coordination intact  Psychiatric:        Behavior: Behavior normal.     ED Results / Procedures / Treatments   Labs (all labs ordered are listed, but only abnormal results are displayed) Labs Reviewed  CBC WITH DIFFERENTIAL/PLATELET - Abnormal; Notable for the following components:      Result Value   WBC 13.8 (*)    RBC 4.07 (*)    Hemoglobin 12.9 (*)    HCT 38.4 (*)    Neutro Abs 9.5 (*)    Monocytes Absolute 1.6 (*)    Abs Immature Granulocytes 0.11 (*)    All other components within normal limits  BASIC METABOLIC PANEL - Abnormal; Notable for the following components:   BUN 26 (*)    Calcium 8.8 (*)    All other components within normal limits  RESP PANEL BY RT-PCR (FLU A&B, COVID) ARPGX2  HIV ANTIBODY (ROUTINE TESTING W REFLEX)    EKG EKG Interpretation  Date/Time:  Thursday Nov 12 2020 08:20:11 EDT Ventricular Rate:  78 PR Interval:  147 QRS Duration: 93 QT Interval:  366 QTC Calculation: 417 R Axis:   27 Text Interpretation: Sinus rhythm Abnormal R-wave progression, early transition No significant change since last tracing  Confirmed by Linwood Dibbles [phone]) on 11/12/2020 8:28:32 AM   Radiology DG Pelvis 1-2 Views  Result Date: 11/12/2020 CLINICAL DATA:  Fall, RIGHT hip pain EXAM: PELVIS - 1-2 VIEW COMPARISON:  None. FINDINGS: Bilateral hip prosthesis.  No dislocation. There is a vertical fracture through the RIGHT lesser trochanter paralleling the stem of the prosthetic. 5 mm gap between the lesser trochanter and the  prosthetic. No pelvic fracture. IMPRESSION: Fracture along the plane of the RIGHT lesser trochanter. No evidence of prosthetic dislocation. Electronically Signed   By: Genevive Bi M.D.   On: 11/12/2020 09:13   DG Femur Min 2 Views Right  Result Date: 11/12/2020 CLINICAL DATA:  Right hip pain after fall yesterday. EXAM: RIGHT FEMUR 2 VIEWS COMPARISON:  Right hip x-rays dated August 21, 2006. FINDINGS: Prior right total hip arthroplasty with acute nondisplaced inter- and subtrochanteric periprosthetic fracture. No dislocation. Minimal degenerative changes of the right knee. Bone mineralization is normal. Soft tissues are unremarkable. Atherosclerotic vascular calcifications. IMPRESSION: 1. Acute nondisplaced intertrochanteric and subtrochanteric periprosthetic right femur fracture. Electronically Signed   By: Obie Dredge M.D.   On: 11/12/2020 09:21    Procedures Procedures   Medications Ordered in ED Medications  amLODipine (NORVASC) tablet 5 mg (has no administration in time range)  atorvastatin (LIPITOR) tablet 40 mg (has no administration in time range)  losartan-hydrochlorothiazide (HYZAAR) 100-25 MG per tablet 1 tablet (has no administration in time range)  metoprolol succinate (TOPROL-XL) 24 hr tablet 50 mg (has no administration in time range)  venlafaxine XR (EFFEXOR-XR) 24 hr capsule 150 mg (has no administration in time range)  HYDROcodone-acetaminophen (NORCO/VICODIN) 5-325 MG per tablet 1-2 tablet (has no administration in time range)  morphine 2 MG/ML injection 0.5 mg (has no  administration in time range)  enoxaparin (LOVENOX) injection 40 mg (has no administration in time range)  senna-docusate (Senokot-S) tablet 1 tablet (has no administration in time range)  0.9 %  sodium chloride infusion (has no administration in time range)  morphine 4 MG/ML injection 4 mg (4 mg Intravenous Given 11/12/20 0947)    ED Course  I have reviewed the triage vital signs and the nursing notes.  Pertinent labs & imaging results that were available during my care of the patient were reviewed by me and considered in my medical decision making (see chart for details).    MDM Rules/Calculators/A&P                         Additional history obtained from: 1. Nursing notes from this visit. 2. Review of electronic medical records. - 65 year old male history of bilateral hip replacements presented for right hip pain after fall last night initially ambulatory now pain has increased.  Neurovascular intact upon initial evaluation.  Will obtain x-ray of the right hip and pelvis.  Patient denies any other injuries, specifically denies head injury loss of consciousness, headache, neck pain or blood thinner use.  He has no additional concerns at this time. ------------------------------- DG Pelvis:  IMPRESSION:  Fracture along the plane of the RIGHT lesser trochanter.    No evidence of prosthetic dislocation.   DG Right Femur:    IMPRESSION:  1. Acute nondisplaced intertrochanteric and subtrochanteric  periprosthetic right femur fracture.  - Consulted with orthopedist APP Earney Hamburg who will contact patient's orthopedic surgeon Dr. Sherlean Foot regarding this patient's care. - Patient reassessed resting comfortably no acute distress states understanding of care plan is agreeable for admission.  Basic labs added.  Hospitalist consulted for admission - CBC shows leukocytosis of 13.8 suspect secondary to acute injury, slight anemia 12.9.  BMP shows no emergent electrolyte derangement, AKI  or gap.  COVID test pending.  Consult with Dr. Katrinka Blazing, patient accepted to medicine service.  Patient seen and evaluated by Dr. Lynelle Doctor during this visit who agrees with plan of care.  Note: Portions of this report may  have been transcribed using voice recognition software. Every effort was made to ensure accuracy; however, inadvertent computerized transcription errors may still be present.  Final Clinical Impression(s) / ED Diagnoses Final diagnoses:  Periprosthetic fracture of femur following total replacement of hip, initial encounter    Rx / DC Orders ED Discharge Orders    None       Elizabeth PalauMorelli, Mccartney Brucks A, PA-C 11/12/20 1120    Linwood DibblesKnapp, Jon, MD 11/13/20 [phone]

## 2020-11-12 NOTE — Progress Notes (Signed)
Pt arrived to unit via stretcherfrom ED accompanied by NT. Pt alert/oriented in no apparent distress. Pt situated/orientated to room/equipments by charge nurse. Menu guide provided with instructions. Hospital valuables policy has been discussed with no complaints. All questions and concerns were fully answered. Bed in lowest position with 3 side rails up,call bell/room phone within reach and all wheels locked.

## 2020-11-12 NOTE — H&P (Addendum)
History and Physical    Tyler Tate IOE:[id number] DOB: 01-18-1956 DOA: 11/12/2020  Referring MD/NP/PA: Harlene Salts, PA-C PCP: Geoffry Paradise, MD  Patient coming from: Home via EMS  Chief Complaint: Hip pain  I have personally briefly reviewed patient's old medical records in [facility]   HPI: Tyler Tate is a 65 y.o. male with medical history significant of hypertension, hyperlipidemia, bilateral hip replacements, and alcohol use presents after having a fall last night with complaints of right hip pain.  He was staying at his daughter's house and had come around a corner tripping over an end table.  He did not hit his head or lose consciousness.  He immediately complained of severe 10/10 pain in his right hip.  He had previously had total right hip replacement back in 2008 by Dr. Sherlean Foot.  Otherwise patient reports that he had been in his normal state of health and denies any lightheadedness, chest pain, palpitations, shortness of breath, cough, nausea, vomiting, abdominal pain, diarrhea, or dysuria.  He does occasionally smoke a cigar when he is on the golf feel and normally drinks 2-3 alcoholic drinks per night on average.  Denies any significant history of alcohol withdrawal.  ED Course: Upon admission into the emergency department patient was seen to be afebrile with respirations 20-22, and all other vital signs maintained.  X-ray imaging revealed acute nondisplaced intertrochanteric and subtrochanteric periprosthetic right hip fracture. Labs significant for WBC 13.8, hemoglobin 12.9, BUN 26, creatinine 1.18, and calcium 8.8.  Influenza and COVID-19 screening were both negative.  Orthopedics was formally consulted.  TRH called to admit    Review of Systems  Constitutional: Negative for fever.  HENT: Negative for congestion and nosebleeds.   Eyes: Negative for blurred vision and photophobia.  Respiratory: Negative for cough and shortness of breath.   Cardiovascular:  Negative for chest pain.  Gastrointestinal: Negative for abdominal pain, diarrhea, nausea and vomiting.  Genitourinary: Negative for dysuria and hematuria.  Musculoskeletal: Positive for falls and joint pain.  Neurological: Negative for loss of consciousness and headaches.  Psychiatric/Behavioral: Positive for substance abuse (Drinks 2-3 alcoholic drinks per day on average).  All other systems reviewed and are negative.   Past Medical History:  Diagnosis Date  . Hypertension     Past Surgical History:  Procedure Laterality Date  . JOINT REPLACEMENT     hip right and left     reports that he has been smoking cigars. He has never used smokeless tobacco. He reports previous alcohol use. He reports that he does not use drugs.  Allergies  Allergen Reactions  . Penicillin G Rash    No family history on file.  Prior to Admission medications   Medication Sig Start Date End Date Taking? Authorizing Provider  amLODipine (NORVASC) 5 MG tablet Take 5 mg by mouth daily. 09/11/15  Yes [provider]  amoxicillin (AMOXIL) 500 MG capsule Take 2,000 mg by mouth as directed. For dental appt. 05/13/19  Yes [provider]  atorvastatin (LIPITOR) 40 MG tablet Take 40 mg by mouth daily. 05/01/15  Yes [provider]  glucosamine-chondroitin 500-400 MG tablet Take 1 tablet by mouth daily.   Yes [provider]  ibuprofen (ADVIL) 200 MG tablet Take 800 mg by mouth every 6 (six) hours as needed for moderate pain.   Yes [provider]  losartan-hydrochlorothiazide (HYZAAR) 100-25 MG tablet Take 1 tablet by mouth daily. 05/10/15  Yes [provider]  metoprolol succinate (TOPROL-XL) 50 MG 24 hr tablet  Take 50 mg by mouth daily. 05/01/15  Yes [provider]  Multiple Vitamins-Minerals (MULTIVITAMIN ADULT PO) Take 1 tablet by mouth daily.   Yes [provider]  omeprazole (PRILOSEC) 20 MG capsule Take 20 mg by mouth every other day.    Yes [provider]  venlafaxine XR (EFFEXOR-XR) 150 MG 24 hr capsule Take 150 mg by mouth daily with breakfast. 08/19/15  Yes [provider]    Physical Exam:  Constitutional: Elderly male NAD, calm, comfortable Vitals:   11/12/20 0815 11/12/20 0816 11/12/20 0819 11/12/20 1000  BP:   (!) 135/105 (!) 144/80  Pulse:   80 79  Resp:   20 (!) 22  Temp: 98.8 F (37.1 C)     TempSrc: Oral     SpO2:   96% 95%  Weight:  95.3 kg    Height:  6\' 1"  (1.854 m)     Eyes: PERRL, lids and conjunctivae normal ENMT: Mucous membranes are moist. Posterior pharynx clear of any exudate or lesions.   Neck: normal, supple, no masses, no thyromegaly Respiratory: clear to auscultation bilaterally, no wheezing, no crackles. Normal respiratory effort. No accessory muscle use.  Cardiovascular: Regular rate and rhythm, no murmurs / rubs / gallops. No extremity edema. 2+ pedal pulses. No carotid bruits.  Abdomen: no tenderness, no masses palpated. No hepatosplenomegaly. Bowel sounds positive.  Musculoskeletal: no clubbing / cyanosis.  Right leg externally rotated and shortened Skin: no rashes, lesions, ulcers. No induration Neurologic: CN 2-12 grossly intact. Sensation intact, DTR normal. Strength 5/5 in all 4.  Psychiatric: Normal judgment and insight. Alert and oriented x 3. Normal mood.     Labs on Admission: I have personally reviewed following labs and imaging studies  CBC: Recent Labs  Lab 11/12/20 0903  WBC 13.8*  NEUTROABS 9.5*  HGB 12.9*  HCT 38.4*  MCV 94.3  PLT 289   Basic Metabolic Panel: Recent Labs  Lab 11/12/20 0903  NA 138  K 4.2  CL 106  CO2 22  GLUCOSE 95  BUN 26*  CREATININE 1.18  CALCIUM 8.8*   GFR: Estimated Creatinine Clearance: 70.5 mL/min (by C-G formula based on SCr of 1.18 mg/dL). Liver Function Tests: No results for input(s): AST, ALT, ALKPHOS, BILITOT, PROT, ALBUMIN in the last 168 hours. No results for input(s): LIPASE, AMYLASE in the last  168 hours. No results for input(s): AMMONIA in the last 168 hours. Coagulation Profile: No results for input(s): INR, PROTIME in the last 168 hours. Cardiac Enzymes: No results for input(s): CKTOTAL, CKMB, CKMBINDEX, TROPONINI in the last 168 hours. BNP (last 3 results) No results for input(s): PROBNP in the last 8760 hours. HbA1C: No results for input(s): HGBA1C in the last 72 hours. CBG: No results for input(s): GLUCAP in the last 168 hours. Lipid Profile: No results for input(s): CHOL, HDL, LDLCALC, TRIG, CHOLHDL, LDLDIRECT in the last 72 hours. Thyroid Function Tests: No results for input(s): TSH, T4TOTAL, FREET4, T3FREE, THYROIDAB in the last 72 hours. Anemia Panel: No results for input(s): VITAMINB12, FOLATE, FERRITIN, TIBC, IRON, RETICCTPCT in the last 72 hours. Urine analysis:    Component Value Date/Time   COLORURINE YELLOW 05/28/2009 1606   APPEARANCEUR CLEAR 05/28/2009 1606   LABSPEC 1.026 05/28/2009 1606   PHURINE 7.0 05/28/2009 1606   GLUCOSEU NEGATIVE 05/28/2009 1606   HGBUR NEGATIVE 05/28/2009 1606   BILIRUBINUR NEGATIVE 05/28/2009 1606   KETONESUR NEGATIVE 05/28/2009 1606   PROTEINUR 100 (A) 05/28/2009 1606   UROBILINOGEN 0.2 05/28/2009 1606  NITRITE NEGATIVE 05/28/2009 1606   LEUKOCYTESUR NEGATIVE 05/28/2009 1606   Sepsis Labs: No results found for this or any previous visit (from the past 240 hour(s)).   Radiological Exams on Admission: DG Pelvis 1-2 Views  Result Date: 11/12/2020 CLINICAL DATA:  Fall, RIGHT hip pain EXAM: PELVIS - 1-2 VIEW COMPARISON:  None. FINDINGS: Bilateral hip prosthesis.  No dislocation. There is a vertical fracture through the RIGHT lesser trochanter paralleling the stem of the prosthetic. 5 mm gap between the lesser trochanter and the prosthetic. No pelvic fracture. IMPRESSION: Fracture along the plane of the RIGHT lesser trochanter. No evidence of prosthetic dislocation. Electronically Signed   By: Genevive Bi M.D.   On:  11/12/2020 09:13   DG Femur Min 2 Views Right  Result Date: 11/12/2020 CLINICAL DATA:  Right hip pain after fall yesterday. EXAM: RIGHT FEMUR 2 VIEWS COMPARISON:  Right hip x-rays dated August 21, 2006. FINDINGS: Prior right total hip arthroplasty with acute nondisplaced inter- and subtrochanteric periprosthetic fracture. No dislocation. Minimal degenerative changes of the right knee. Bone mineralization is normal. Soft tissues are unremarkable. Atherosclerotic vascular calcifications. IMPRESSION: 1. Acute nondisplaced intertrochanteric and subtrochanteric periprosthetic right femur fracture. Electronically Signed   By: Obie Dredge M.D.   On: 11/12/2020 09:21    EKG: Independently reviewed.  Sinus rhythm at 78 bpm  Assessment/Plan Nondisplaced intertrochanteric and subtrochanteric periprosthetic right femur fracture secondary to fall: Acute.  Patient appears to have had a mechanical fall while at his daughter's house yesterday.  Denied any loss of consciousness or trauma to his head.  X-rays revealed right periprosthetic hip fracture.  Orthopedics consulted -Admit to a surgical telemetry bed -Hip fracture order set -Heart healthy diet(patient requested diet be changed to regular) and n.p.o. after midnight for possible need of procedure in the a.m. -Hydrocodone/morphine as needed for moderate to severe pain -Appreciate orthopedic consultative services, we will follow-up for any further recommendation  Leukocytosis: Acute. WBC elevated 13.8. Secondary to acute fracture.  -Recheck CBC in a.m.  Normocytic anemia: Hemoglobin 12.9 g/dL.  Patient denies any reports of bleeding. -Continue to monitor  Essential hypertension: Home blood pressure medications include amlodipine 5 mg daily, losartan hydrochlorothiazide 100-25 mg daily, and metoprolol succinate 50 mg daily. -Continue current home regimen  Hyperlipidemia: Patient on Lipitor 40 mg daily. -Continue statin  Alcohol use: Patient  reports drinking 2-3 alcoholic drinks per night on average. -CIWA protocols with scheduled Ativan and as needed  GERD -Continue pharmacy substitution of Protonix  DVT prophylaxis: lovenox  Code Status: Full Family Communication: Patient stated that he had already updated his family Disposition Plan: To be determined, but suspect likely need of rehab Consults called: Orthopedics Admission status: Inpatient require more than 2 midnight stay  Clydie Braun MD [facility]   If 7PM-7AM, please contact night-coverage   11/12/2020, 10:59 AM

## 2020-11-12 NOTE — ED Notes (Signed)
Patient transported to X-ray 

## 2020-11-13 ENCOUNTER — Other Ambulatory Visit: Payer: Self-pay

## 2020-11-13 DIAGNOSIS — S72001A Fracture of unspecified part of neck of right femur, initial encounter for closed fracture: Secondary | ICD-10-CM | POA: Diagnosis not present

## 2020-11-13 LAB — MAGNESIUM: Magnesium: 1.8 mg/dL (ref 1.7–2.4)

## 2020-11-13 LAB — CBC
HCT: 38.5 % — ABNORMAL LOW (ref 39.0–52.0)
Hemoglobin: 13 g/dL (ref 13.0–17.0)
MCH: 31.7 pg (ref 26.0–34.0)
MCHC: 33.8 g/dL (ref 30.0–36.0)
MCV: 93.9 fL (ref 80.0–100.0)
Platelets: 212 10*3/uL (ref 150–400)
RBC: 4.1 MIL/uL — ABNORMAL LOW (ref 4.22–5.81)
RDW: 12.4 % (ref 11.5–15.5)
WBC: 7.9 10*3/uL (ref 4.0–10.5)
nRBC: 0 % (ref 0.0–0.2)

## 2020-11-13 LAB — PHOSPHORUS: Phosphorus: 3.4 mg/dL (ref 2.5–4.6)

## 2020-11-13 LAB — BASIC METABOLIC PANEL
Anion gap: 8 (ref 5–15)
BUN: 16 mg/dL (ref 8–23)
CO2: 26 mmol/L (ref 22–32)
Calcium: 8.9 mg/dL (ref 8.9–10.3)
Chloride: 101 mmol/L (ref 98–111)
Creatinine, Ser: 0.97 mg/dL (ref 0.61–1.24)
GFR, Estimated: >60 mL/min (ref >60)
Glucose, Bld: 109 mg/dL — ABNORMAL HIGH (ref 70–99)
Potassium: 3.7 mmol/L (ref 3.5–5.1)
Sodium: 135 mmol/L (ref 135–145)

## 2020-11-13 LAB — TYPE AND SCREEN
ABO/RH(D): O POS
Antibody Screen: NEGATIVE

## 2020-11-13 MED ORDER — SODIUM CHLORIDE 0.9 % IV SOLN: Freq: Once | INTRAVENOUS | Status: AC

## 2020-11-13 NOTE — Plan of Care (Signed)
  Problem: Education: Goal: Knowledge of General Education information will improve Description: Including pain rating scale, medication(s)/side effects and non-pharmacologic comfort measures Outcome: Progressing   Problem: Health Behavior/Discharge Planning: Goal: Ability to manage health-related needs will improve Outcome: Progressing   Problem: Clinical Measurements: Goal: Will remain free from infection Outcome: Progressing   Problem: Activity: Goal: Risk for activity intolerance will decrease Outcome: Progressing   Problem: Coping: Goal: Level of anxiety will decrease Outcome: Progressing   Problem: Pain Managment: Goal: General experience of comfort will improve Outcome: Progressing   Problem: Safety: Goal: Ability to remain free from injury will improve Outcome: Progressing   

## 2020-11-13 NOTE — TOC CAGE-AID Note (Signed)
Transition of Care [facility]) - CAGE-AID Screening   Patient Details  Name: Tyler Tate MRN: [id number] Date of Birth: 01-Nov-1955      Hewitt Shorts, RN  Trauma Response Nurse Phone Number:[phone] 11/13/2020, 10:47 AM      CAGE-AID Screening:    Have You Ever Felt You Ought to Cut Down on Your Drinking or Drug Use?: No Have People Annoyed You By Critizing Your Drinking Or Drug Use?: No Have You Felt Bad Or Guilty About Your Drinking Or Drug Use?: No Have You Ever Had a Drink or Used Drugs First Thing In The Morning to Steady Your Nerves or to Get Rid of a Hangover?: No CAGE-AID Score: 0  Substance Abuse Education Offered: Yes (admits to drinking daily, declines any education information- states "The doctor did put me on the medicine in case I get shaky")

## 2020-11-13 NOTE — Progress Notes (Signed)
PROGRESS NOTE    Tyler Tate  TIR:[id number] DOB: 1955-11-03 DOA: 11/12/2020 PCP: Burnard Bunting, MD   Chief Complaint  Patient presents with  . Hip Pain  Brief Narrative: 65 year old male with hypertension hyperlipidemia bilateral hip replacement and alcohol use presented with a fall last night and having right hip pain since then.  He tripped over an end table at his daughter's house without head injury or loss of consciousness. He was seen in the ED vitals stable mildly tachypneic x-ray showed acute nondisplaced intertrochanteric and subtrochanteric periprosthetic right hip fracture labs showed leukocytosis, COVID-19 done and admitted with orthopedic consultation  Subjective: Seen this morning pain is controlled with medication.  Wondering about orthopedic evaluation Reports at baseline he is pretty ambulatory  Assessment & Plan:  Acute nondisplaced intertrochanteric and subtrochanteric periprosthetic right hip fracture: Awaiting for further orthopedic evaluation for operative intervention.  He is otherwise healthy at baseline with no known cardiac issues/no diabetes no stroke no CKD and MET at least more than 4, and is an acceptable risk for needed intermediate risk procedure. Currently denies chest pain. Pain control DVT prophylaxis diet-defer to orthopedic  Leukocytosis: Likely reactive.  Resolved.  Alcohol use disorder watch for withdrawal CIWA Ativan, thiamine/folate.drinks 2-3 drinks per night on average  GERD: Continue Protonix for now.   Hyperlipidemia: Continues Lipitor  Essential hypertension: Borderline control on multiple regimen-losartan, amlodipine, HCTZ, metoprolol.  Continue to monitor  Diet Order            Diet regular Room service appropriate? Yes; Fluid consistency: Thin  Diet effective now               Patient's Body mass index is 27.71 kg/m. DVT prophylaxis: SCD.  Chemical prophylaxis postop we will defer to orthopedics Code Status:   Code  Status: Full Code  Family Communication: plan of care discussed with patient at bedside.  Status is: Inpatient  Remains inpatient appropriate because:Inpatient level of care appropriate due to severity of illness and For management of hip fracture   Dispo: The patient is from: Home              Anticipated d/c is to: SNF              Patient currently is not medically stable to d/c.   Difficult to place patient No       Unresulted Labs (From admission, onward)          Start     Ordered   11/13/20 0500  Calcium, ionized  Tomorrow morning,   R        11/12/20 1108          Medications reviewed:  Scheduled Meds: . amLODipine  5 mg Oral Daily  . atorvastatin  40 mg Oral Daily  . folic acid  1 mg Oral Daily  . hydrochlorothiazide  25 mg Oral Daily  . LORazepam  0-4 mg Intravenous Q6H   Followed by  . [START ON 11/14/2020] LORazepam  0-4 mg Intravenous Q12H  . losartan  100 mg Oral Daily  . metoprolol succinate  50 mg Oral Daily  . multivitamin with minerals  1 tablet Oral Daily  . senna-docusate  1 tablet Oral BID  . thiamine  100 mg Oral Daily   Or  . thiamine  100 mg Intravenous Daily  . venlafaxine XR  150 mg Oral Q breakfast   Continuous Infusions:  Consultants:see note  Procedures:see note  Antimicrobials: Anti-infectives (From admission, onward)   None  Culture/Microbiology No results found for: SDES, SPECREQUEST, CULT, REPTSTATUS  Other culture-see note  Objective: Vitals: Today's Vitals   11/13/20 0554 11/13/20 0623 11/13/20 0727 11/13/20 0802  BP:    (!) 171/78  Pulse:    78  Resp:    18  Temp:    98.9 F (37.2 C)  TempSrc:      SpO2:    95%  Weight:      Height:      PainSc: 9  4  0-No pain     Intake/Output Summary (Last 24 hours) at 11/13/2020 1055 Last data filed at 11/13/2020 0158 Gross per 24 hour  Intake 472.64 ml  Output 650 ml  Net -177.36 ml   Filed Weights   11/12/20 0816  Weight: 95.3 kg   Weight change:    Intake/Output from previous day: 05/19 0701 - 05/20 0700 In: 472.6 [P.O.:400; I.V.:72.6] Out: 650 [Urine:650] Intake/Output this shift: No intake/output data recorded. Filed Weights   11/12/20 0816  Weight: 95.3 kg    Examination: General exam: AAO x3 , older than stated age, weak appearing. HEENT:Oral mucosa moist, Ear/Nose WNL grossly,dentition normal. Respiratory system: bilaterally diminished, no wheezing or crackles no use of accessory muscle, non tender. Cardiovascular system: S1 & S2 +, no murmur no JVD. Gastrointestinal system: Abdomen soft, NT,ND, BS+. Nervous System:Alert, awake, moving extremities Extremities: no edema, distal peripheral pulses palpable.  Right leg externally rotated and shortened and rt hip is tender Skin: No rashes,no icterus. MSK: Normal muscle bulk,tone, power  Data Reviewed: I have personally reviewed following labs and imaging studies CBC: Recent Labs  Lab 11/12/20 0903 11/13/20 0248  WBC 13.8* 7.9  NEUTROABS 9.5*  --   HGB 12.9* 13.0  HCT 38.4* 38.5*  MCV 94.3 93.9  PLT 289 355   Basic Metabolic Panel: Recent Labs  Lab 11/12/20 0903 11/13/20 0248  NA 138 135  K 4.2 3.7  CL 106 101  CO2 22 26  GLUCOSE 95 109*  BUN 26* 16  CREATININE 1.18 0.97  CALCIUM 8.8* 8.9  MG  --  1.8  PHOS  --  3.4   GFR: Estimated Creatinine Clearance: 85.8 mL/min (by C-G formula based on SCr of 0.97 mg/dL). Liver Function Tests: No results for input(s): AST, ALT, ALKPHOS, BILITOT, PROT, ALBUMIN in the last 168 hours. No results for input(s): LIPASE, AMYLASE in the last 168 hours. No results for input(s): AMMONIA in the last 168 hours. Coagulation Profile: No results for input(s): INR, PROTIME in the last 168 hours. Cardiac Enzymes: No results for input(s): CKTOTAL, CKMB, CKMBINDEX, TROPONINI in the last 168 hours. BNP (last 3 results) No results for input(s): PROBNP in the last 8760 hours. HbA1C: No results for input(s): HGBA1C in the last 72  hours. CBG: No results for input(s): GLUCAP in the last 168 hours. Lipid Profile: No results for input(s): CHOL, HDL, LDLCALC, TRIG, CHOLHDL, LDLDIRECT in the last 72 hours. Thyroid Function Tests: No results for input(s): TSH, T4TOTAL, FREET4, T3FREE, THYROIDAB in the last 72 hours. Anemia Panel: No results for input(s): VITAMINB12, FOLATE, FERRITIN, TIBC, IRON, RETICCTPCT in the last 72 hours. Sepsis Labs: No results for input(s): PROCALCITON, LATICACIDVEN in the last 168 hours.  Recent Results (from the past 240 hour(s))  Resp Panel by RT-PCR (Flu A&B, Covid) Nasopharyngeal Swab     Status: None   Collection Time: 11/12/20  9:03 AM   Specimen: Nasopharyngeal Swab; Nasopharyngeal(NP) swabs in vial transport medium  Result Value Ref Range Status  SARS Coronavirus 2 by RT PCR NEGATIVE NEGATIVE Final    Comment: (NOTE) SARS-CoV-2 target nucleic acids are NOT DETECTED.  The SARS-CoV-2 RNA is generally detectable in upper respiratory specimens during the acute phase of infection. The lowest concentration of SARS-CoV-2 viral copies this assay can detect is 138 copies/mL. A negative result does not preclude SARS-Cov-2 infection and should not be used as the sole basis for treatment or other patient management decisions. A negative result may occur with  improper specimen collection/handling, submission of specimen other than nasopharyngeal swab, presence of viral mutation(s) within the areas targeted by this assay, and inadequate number of viral copies(<138 copies/mL). A negative result must be combined with clinical observations, patient history, and epidemiological information. The expected result is Negative.  Fact Sheet for Patients:  EntrepreneurPulse.com.au  Fact Sheet for Healthcare Providers:  IncredibleEmployment.be  This test is no t yet approved or cleared by the [place] FDA and  has been authorized for detection and/or  diagnosis of SARS-CoV-2 by FDA under an Emergency Use Authorization (EUA). This EUA will remain  in effect (meaning this test can be used) for the duration of the COVID-19 declaration under Section 564(b)(1) of the Act, 21 U.S.C.section 360bbb-3(b)(1), unless the authorization is terminated  or revoked sooner.       Influenza A by PCR NEGATIVE NEGATIVE Final   Influenza B by PCR NEGATIVE NEGATIVE Final    Comment: (NOTE) The Xpert Xpress SARS-CoV-2/FLU/RSV plus assay is intended as an aid in the diagnosis of influenza from Nasopharyngeal swab specimens and should not be used as a sole basis for treatment. Nasal washings and aspirates are unacceptable for Xpert Xpress SARS-CoV-2/FLU/RSV testing.  Fact Sheet for Patients: EntrepreneurPulse.com.au  Fact Sheet for Healthcare Providers: IncredibleEmployment.be  This test is not yet approved or cleared by the [place] FDA and has been authorized for detection and/or diagnosis of SARS-CoV-2 by FDA under an Emergency Use Authorization (EUA). This EUA will remain in effect (meaning this test can be used) for the duration of the COVID-19 declaration under Section 564(b)(1) of the Act, 21 U.S.C. section 360bbb-3(b)(1), unless the authorization is terminated or revoked.  Performed at [facility], [place] [address], [place], [place] [address]   Surgical pcr screen     Status: None   Collection Time: 11/12/20  1:48 PM   Specimen: Nasal Mucosa; Nasal Swab  Result Value Ref Range Status   MRSA, PCR NEGATIVE NEGATIVE Final   Staphylococcus aureus NEGATIVE NEGATIVE Final    Comment: (NOTE) The Xpert SA Assay (FDA approved for NASAL specimens in patients 71 years of age and older), is one component of a comprehensive surveillance program. It is not intended to diagnose infection nor to guide or monitor treatment. Performed at [facility], [place] [address], [place], [place] [address]       Radiology Studies: DG Pelvis 1-2 Views  Result Date: 11/12/2020 CLINICAL DATA:  Fall, RIGHT hip pain EXAM: PELVIS - 1-2 VIEW COMPARISON:  None. FINDINGS: Bilateral hip prosthesis.  No dislocation. There is a vertical fracture through the RIGHT lesser trochanter paralleling the stem of the prosthetic. 5 mm gap between the lesser trochanter and the prosthetic. No pelvic fracture. IMPRESSION: Fracture along the plane of the RIGHT lesser trochanter. No evidence of prosthetic dislocation. Electronically Signed   By: Suzy Bouchard M.D.   On: 11/12/2020 09:13   CT HIP RIGHT WO CONTRAST  Result Date: 11/12/2020 CLINICAL DATA:  Right hip fracture after fall. EXAM:  CT OF THE RIGHT HIP WITHOUT CONTRAST TECHNIQUE: Multidetector CT imaging of the right hip was performed according to the standard protocol. Multiplanar CT image reconstructions were also generated. COMPARISON:  Pelvis and right femur x-rays from same day. FINDINGS: Bones/Joint/Cartilage Prior right total hip arthroplasty. Acute periprosthetic fracture again noted with longitudinal component through the right lesser trochanter paralleling the femoral stem, extending superiorly into the posterior greater tuberosity (series 9, image 33). There is up to 4 mm distraction of the fracture fragments at the base of the lesser trochanter. There is an oblique nondisplaced transverse component through the posterior and lateral subtrochanteric femur (series 7, image 75; series 9, image 35). No dislocation. No joint effusion. Right greater trochanteric lipohemobursitis. Ligaments Ligaments are suboptimally evaluated by CT. Muscles and Tendons Grossly intact. Soft tissue No fluid collection or hematoma.  No soft tissue mass. IMPRESSION: 1. Prior right total hip arthroplasty with acute periprosthetic inter- and subtrochanteric fracture as described above. 2. Right greater trochanteric lipohemobursitis. Electronically Signed   By: Titus Dubin M.D.   On: 11/12/2020  13:43   Chest Portable 1 View  Result Date: 11/12/2020 CLINICAL DATA:  Preop RIGHT hip fracture EXAM: PORTABLE CHEST 1 VIEW COMPARISON:  None. FINDINGS: Normal mediastinum and cardiac silhouette. Normal pulmonary vasculature. No evidence of effusion, infiltrate, or pneumothorax. No acute bony abnormality. IMPRESSION: Low lung volumes.  No acute cardiopulmonary process. Electronically Signed   By: Suzy Bouchard M.D.   On: 11/12/2020 12:25   DG Femur Min 2 Views Right  Result Date: 11/12/2020 CLINICAL DATA:  Right hip pain after fall yesterday. EXAM: RIGHT FEMUR 2 VIEWS COMPARISON:  Right hip x-rays dated August 21, 2006. FINDINGS: Prior right total hip arthroplasty with acute nondisplaced inter- and subtrochanteric periprosthetic fracture. No dislocation. Minimal degenerative changes of the right knee. Bone mineralization is normal. Soft tissues are unremarkable. Atherosclerotic vascular calcifications. IMPRESSION: 1. Acute nondisplaced intertrochanteric and subtrochanteric periprosthetic right femur fracture. Electronically Signed   By: Titus Dubin M.D.   On: 11/12/2020 09:21     LOS: 1 day   Antonieta Pert, MD [facility]  11/13/2020, 10:55 AM

## 2020-11-13 NOTE — Consult Note (Signed)
Reason for Consult:Right hip fx Referring Physician: Kirby Crigler Time called: 5/19@0905  Time at bedside: 5/19@1207    Tyler Tate is an 65 y.o. male.  HPI: Tyler Tate tripped and fell night before last when he hit an end table. He had immediate right hip pain and could not get up or bear weight. He was brought to the ED where x-rays showed a periprosthetic right hip fx and orthopedic surgery was consulted. He is a pt of Dannielle Huh. He does not use any assistive devices to ambulate.  Past Medical History:  Diagnosis Date  . Hypertension     Past Surgical History:  Procedure Laterality Date  . JOINT REPLACEMENT     hip right and left    No family history on file.  Social History:  reports that he has been smoking cigars. He has never used smokeless tobacco. He reports previous alcohol use. He reports that he does not use drugs.  Allergies:  Allergies  Allergen Reactions  . Penicillin G Rash    Medications: I have reviewed the patient's current medications.  Results for orders placed or performed during the hospital encounter of 11/12/20 (from the past 48 hour(s))  CBC with Differential     Status: Abnormal   Collection Time: 11/12/20  9:03 AM  Result Value Ref Range   WBC 13.8 (H) 4.0 - 10.5 K/uL   RBC 4.07 (L) 4.22 - 5.81 MIL/uL   Hemoglobin 12.9 (L) 13.0 - 17.0 g/dL   HCT 16.1 (L) 09.6 - 04.5 %   MCV 94.3 80.0 - 100.0 fL   MCH 31.7 26.0 - 34.0 pg   MCHC 33.6 30.0 - 36.0 g/dL   RDW 40.9 81.1 - 91.4 %   Platelets 289 150 - 400 K/uL   nRBC 0.0 0.0 - 0.2 %   Neutrophils Relative % 68 %   Neutro Abs 9.5 (H) 1.7 - 7.7 K/uL   Lymphocytes Relative 17 %   Lymphs Abs 2.3 0.7 - 4.0 K/uL   Monocytes Relative 12 %   Monocytes Absolute 1.6 (H) 0.1 - 1.0 K/uL   Eosinophils Relative 1 %   Eosinophils Absolute 0.1 0.0 - 0.5 K/uL   Basophils Relative 1 %   Basophils Absolute 0.1 0.0 - 0.1 K/uL   Immature Granulocytes 1 %   Abs Immature Granulocytes 0.11 (H) 0.00 - 0.07 K/uL     Comment: Performed at [facility], [address] [place], [place] [address]  Basic metabolic panel     Status: Abnormal   Collection Time: 11/12/20  9:03 AM  Result Value Ref Range   Sodium 138 135 - 145 mmol/L   Potassium 4.2 3.5 - 5.1 mmol/L   Chloride 106 98 - 111 mmol/L   CO2 22 22 - 32 mmol/L   Glucose, Bld 95 70 - 99 mg/dL    Comment: Glucose reference range applies only to samples taken after fasting for at least 8 hours.   BUN 26 (H) 8 - 23 mg/dL   Creatinine, Ser 6.21 0.61 - 1.24 mg/dL   Calcium 8.8 (L) 8.9 - 10.3 mg/dL   GFR, Estimated >30 >86 mL/min    Comment: (NOTE) Calculated using the CKD-EPI Creatinine Equation (2021)    Anion gap 10 5 - 15    Comment: Performed at [facility], [address], [place], [place] [address]  Resp Panel by RT-PCR (Flu A&B, Covid) Nasopharyngeal Swab     Status: None   Collection Time: 11/12/20  9:03 AM  Specimen: Nasopharyngeal Swab; Nasopharyngeal(NP) swabs in vial transport medium  Result Value Ref Range   SARS Coronavirus 2 by RT PCR NEGATIVE NEGATIVE    Comment: (NOTE) SARS-CoV-2 target nucleic acids are NOT DETECTED.  The SARS-CoV-2 RNA is generally detectable in upper respiratory specimens during the acute phase of infection. The lowest concentration of SARS-CoV-2 viral copies this assay can detect is 138 copies/mL. A negative result does not preclude SARS-Cov-2 infection and should not be used as the sole basis for treatment or other patient management decisions. A negative result may occur with  improper specimen collection/handling, submission of specimen other than nasopharyngeal swab, presence of viral mutation(s) within the areas targeted by this assay, and inadequate number of viral copies(<138 copies/mL). A negative result must be combined with clinical observations, patient history, and epidemiological information. The expected result is Negative.  Fact Sheet for Patients:   BloggerCourse.com  Fact Sheet for Healthcare Providers:  SeriousBroker.it  This test is no t yet approved or cleared by the [place] FDA and  has been authorized for detection and/or diagnosis of SARS-CoV-2 by FDA under an Emergency Use Authorization (EUA). This EUA will remain  in effect (meaning this test can be used) for the duration of the COVID-19 declaration under Section 564(b)(1) of the Act, 21 U.S.C.section 360bbb-3(b)(1), unless the authorization is terminated  or revoked sooner.       Influenza A by PCR NEGATIVE NEGATIVE   Influenza B by PCR NEGATIVE NEGATIVE    Comment: (NOTE) The Xpert Xpress SARS-CoV-2/FLU/RSV plus assay is intended as an aid in the diagnosis of influenza from Nasopharyngeal swab specimens and should not be used as a sole basis for treatment. Nasal washings and aspirates are unacceptable for Xpert Xpress SARS-CoV-2/FLU/RSV testing.  Fact Sheet for Patients: BloggerCourse.com  Fact Sheet for Healthcare Providers: SeriousBroker.it  This test is not yet approved or cleared by the [place] FDA and has been authorized for detection and/or diagnosis of SARS-CoV-2 by FDA under an Emergency Use Authorization (EUA). This EUA will remain in effect (meaning this test can be used) for the duration of the COVID-19 declaration under Section 564(b)(1) of the Act, 21 U.S.C. section 360bbb-3(b)(1), unless the authorization is terminated or revoked.  Performed at [facility], [address], [place], [place] [address]   HIV Antibody (routine testing w rflx)     Status: None   Collection Time: 11/12/20  1:44 PM  Result Value Ref Range   HIV Screen 4th Generation wRfx Non Reactive Non Reactive    Comment: Performed at [facility], [address], [place], [place] [address]  Surgical pcr screen     Status: None   Collection Time: 11/12/20   1:48 PM   Specimen: Nasal Mucosa; Nasal Swab  Result Value Ref Range   MRSA, PCR NEGATIVE NEGATIVE   Staphylococcus aureus NEGATIVE NEGATIVE    Comment: (NOTE) The Xpert SA Assay (FDA approved for NASAL specimens in patients 79 years of age and older), is one component of a comprehensive surveillance program. It is not intended to diagnose infection nor to guide or monitor treatment. Performed at [facility], [address] [place], [place] [address]   Type and screen [facility]     Status: None   Collection Time: 11/13/20  2:43 AM  Result Value Ref Range   ABO/RH(D) O POS    Antibody Screen NEG    Sample Expiration      11/16/2020,2359 Performed  at [facility], [address] [place], [place] [address]   Basic metabolic panel     Status: Abnormal   Collection Time: 11/13/20  2:48 AM  Result Value Ref Range   Sodium 135 135 - 145 mmol/L   Potassium 3.7 3.5 - 5.1 mmol/L   Chloride 101 98 - 111 mmol/L   CO2 26 22 - 32 mmol/L   Glucose, Bld 109 (H) 70 - 99 mg/dL    Comment: Glucose reference range applies only to samples taken after fasting for at least 8 hours.   BUN 16 8 - 23 mg/dL   Creatinine, Ser 6.30 0.61 - 1.24 mg/dL   Calcium 8.9 8.9 - 16.0 mg/dL   GFR, Estimated >10 >93 mL/min    Comment: (NOTE) Calculated using the CKD-EPI Creatinine Equation (2021)    Anion gap 8 5 - 15    Comment: Performed at [facility], [address] [place], [place] [address]  CBC     Status: Abnormal   Collection Time: 11/13/20  2:48 AM  Result Value Ref Range   WBC 7.9 4.0 - 10.5 K/uL   RBC 4.10 (L) 4.22 - 5.81 MIL/uL   Hemoglobin 13.0 13.0 - 17.0 g/dL   HCT 32.2 (L) 02.5 - 42.7 %   MCV 93.9 80.0 - 100.0 fL   MCH 31.7 26.0 - 34.0 pg   MCHC 33.8 30.0 - 36.0 g/dL   RDW 06.2 37.6 - 28.3 %   Platelets 212 150 - 400 K/uL   nRBC 0.0 0.0 - 0.2 %    Comment: Performed at [facility], [address], [place], [place] [address]  Magnesium      Status: None   Collection Time: 11/13/20  2:48 AM  Result Value Ref Range   Magnesium 1.8 1.7 - 2.4 mg/dL    Comment: Performed at [facility], [address] [place], [place] [address]  Phosphorus     Status: None   Collection Time: 11/13/20  2:48 AM  Result Value Ref Range   Phosphorus 3.4 2.5 - 4.6 mg/dL    Comment: Performed at [facility], [address] [place], [place] [address]    DG Pelvis 1-2 Views  Result Date: 11/12/2020 CLINICAL DATA:  Fall, RIGHT hip pain EXAM: PELVIS - 1-2 VIEW COMPARISON:  None. FINDINGS: Bilateral hip prosthesis.  No dislocation. There is a vertical fracture through the RIGHT lesser trochanter paralleling the stem of the prosthetic. 5 mm gap between the lesser trochanter and the prosthetic. No pelvic fracture. IMPRESSION: Fracture along the plane of the RIGHT lesser trochanter. No evidence of prosthetic dislocation. Electronically Signed   By: Genevive Bi M.D.   On: 11/12/2020 09:13   CT HIP RIGHT WO CONTRAST  Result Date: 11/12/2020 CLINICAL DATA:  Right hip fracture after fall. EXAM: CT OF THE RIGHT HIP WITHOUT CONTRAST TECHNIQUE: Multidetector CT imaging of the right hip was performed according to the standard protocol. Multiplanar CT image reconstructions were also generated. COMPARISON:  Pelvis and right femur x-rays from same day. FINDINGS: Bones/Joint/Cartilage Prior right total hip arthroplasty. Acute periprosthetic fracture again noted with longitudinal component through the right lesser trochanter paralleling the femoral stem, extending superiorly into the posterior greater tuberosity (series 9, image 33). There is up to 4 mm distraction of the fracture fragments at the base of the lesser trochanter. There is an oblique nondisplaced transverse component through the posterior and lateral subtrochanteric femur (series 7, image  75; series 9, image 35). No dislocation. No joint effusion. Right greater trochanteric lipohemobursitis.  Ligaments Ligaments are suboptimally evaluated by CT. Muscles and Tendons Grossly intact. Soft tissue No fluid collection or hematoma.  No soft tissue mass. IMPRESSION: 1. Prior right total hip arthroplasty with acute periprosthetic inter- and subtrochanteric fracture as described above. 2. Right greater trochanteric lipohemobursitis. Electronically Signed   By: Obie Dredge M.D.   On: 11/12/2020 13:43   Chest Portable 1 View  Result Date: 11/12/2020 CLINICAL DATA:  Preop RIGHT hip fracture EXAM: PORTABLE CHEST 1 VIEW COMPARISON:  None. FINDINGS: Normal mediastinum and cardiac silhouette. Normal pulmonary vasculature. No evidence of effusion, infiltrate, or pneumothorax. No acute bony abnormality. IMPRESSION: Low lung volumes.  No acute cardiopulmonary process. Electronically Signed   By: Genevive Bi M.D.   On: 11/12/2020 12:25   DG Femur Min 2 Views Right  Result Date: 11/12/2020 CLINICAL DATA:  Right hip pain after fall yesterday. EXAM: RIGHT FEMUR 2 VIEWS COMPARISON:  Right hip x-rays dated August 21, 2006. FINDINGS: Prior right total hip arthroplasty with acute nondisplaced inter- and subtrochanteric periprosthetic fracture. No dislocation. Minimal degenerative changes of the right knee. Bone mineralization is normal. Soft tissues are unremarkable. Atherosclerotic vascular calcifications. IMPRESSION: 1. Acute nondisplaced intertrochanteric and subtrochanteric periprosthetic right femur fracture. Electronically Signed   By: Obie Dredge M.D.   On: 11/12/2020 09:21    Review of Systems  HENT: Negative for ear discharge, ear pain, hearing loss and tinnitus.   Eyes: Negative for photophobia and pain.  Respiratory: Negative for cough and shortness of breath.   Cardiovascular: Negative for chest pain.  Gastrointestinal: Negative for abdominal pain, nausea and vomiting.  Genitourinary: Negative for dysuria, flank pain, frequency and urgency.  Musculoskeletal: Positive for arthralgias  (Right hip). Negative for back pain, myalgias and neck pain.  Neurological: Negative for dizziness and headaches.  Hematological: Does not bruise/bleed easily.  Psychiatric/Behavioral: The patient is not nervous/anxious.    Blood pressure (!) 171/78, pulse 78, temperature 98.9 F (37.2 C), resp. rate 18, height 6\' 1"  (1.854 m), weight 95.3 kg, SpO2 95 %. Physical Exam Constitutional:      General: He is not in acute distress.    Appearance: He is well-developed. He is not diaphoretic.  HENT:     Head: Normocephalic and atraumatic.  Eyes:     General: No scleral icterus.       Right eye: No discharge.        Left eye: No discharge.     Conjunctiva/sclera: Conjunctivae normal.  Cardiovascular:     Rate and Rhythm: Normal rate and regular rhythm.  Pulmonary:     Effort: Pulmonary effort is normal. No respiratory distress.  Musculoskeletal:     Cervical back: Normal range of motion.     Comments: RLE No traumatic wounds, ecchymosis, or rash  Mod TTP hip  No knee or ankle effusion  Knee stable to varus/ valgus and anterior/posterior stress  Sens DPN, SPN, TN intact  Motor EHL, ext, flex, evers 5/5  DP 2+, PT 2+, No significant edema  Skin:    General: Skin is warm and dry.  Neurological:     Mental Status: He is alert.  Psychiatric:        Behavior: Behavior normal.     Assessment/Plan: Right hip fx -- CT reviewed by Dr. who feels like construct is stable and operative intervention unnecessary. Will also have Dr. Linna Caprice review but will plan non-operative management for now with  NWB RLE and PT/OT. He should f/u with Dr. Sherlean FootLucey when discharged.    Freeman CaldronMichael J. Ahlana Slaydon, PA-C Orthopedic Surgery [phone] 11/13/2020, 10:34 AM

## 2020-11-14 DIAGNOSIS — S72001A Fracture of unspecified part of neck of right femur, initial encounter for closed fracture: Secondary | ICD-10-CM | POA: Diagnosis not present

## 2020-11-14 LAB — CBC
HCT: 38.6 % — ABNORMAL LOW (ref 39.0–52.0)
Hemoglobin: 13.1 g/dL (ref 13.0–17.0)
MCH: 31.6 pg (ref 26.0–34.0)
MCHC: 33.9 g/dL (ref 30.0–36.0)
MCV: 93 fL (ref 80.0–100.0)
Platelets: 215 10*3/uL (ref 150–400)
RBC: 4.15 MIL/uL — ABNORMAL LOW (ref 4.22–5.81)
RDW: 12.2 % (ref 11.5–15.5)
WBC: 7.5 10*3/uL (ref 4.0–10.5)
nRBC: 0 % (ref 0.0–0.2)

## 2020-11-14 LAB — BASIC METABOLIC PANEL
Anion gap: 9 (ref 5–15)
BUN: 14 mg/dL (ref 8–23)
CO2: 27 mmol/L (ref 22–32)
Calcium: 9.2 mg/dL (ref 8.9–10.3)
Chloride: 100 mmol/L (ref 98–111)
Creatinine, Ser: 0.93 mg/dL (ref 0.61–1.24)
GFR, Estimated: >60 mL/min (ref >60)
Glucose, Bld: 111 mg/dL — ABNORMAL HIGH (ref 70–99)
Potassium: 3.8 mmol/L (ref 3.5–5.1)
Sodium: 136 mmol/L (ref 135–145)

## 2020-11-14 MED ORDER — METHOCARBAMOL 500 MG PO TABS
500.0000 mg | ORAL_TABLET | Freq: Three times a day (TID) | ORAL | Status: DC | PRN
  Administered 2020-11-14 (×2): 500 mg via ORAL
  Filled 2020-11-14 (×2): qty 1

## 2020-11-14 MED ORDER — METHOCARBAMOL 500 MG PO TABS
500.0000 mg | ORAL_TABLET | Freq: Four times a day (QID) | ORAL | Status: DC | PRN
  Administered 2020-11-14 – 2020-11-16 (×5): 500 mg via ORAL
  Filled 2020-11-14 (×5): qty 1

## 2020-11-14 MED ORDER — TRAMADOL HCL 50 MG PO TABS
50.0000 mg | ORAL_TABLET | Freq: Four times a day (QID) | ORAL | Status: DC | PRN
  Administered 2020-11-14 – 2020-11-15 (×4): 50 mg via ORAL
  Filled 2020-11-14 (×4): qty 1

## 2020-11-14 NOTE — Progress Notes (Addendum)
PROGRESS NOTE    Tyler Tate  WUJ:[id number] DOB: 09-23-1955 DOA: 11/12/2020 PCP: Geoffry Paradise, MD   Chief Complaint  Patient presents with  . Hip Pain  Brief Narrative: 65 year old male with hypertension hyperlipidemia bilateral hip replacement and alcohol use presented with a fall last night and having right hip pain since then.  He tripped over an end table at his daughter's house without head injury or loss of consciousness. He was seen in the ED vitals stable mildly tachypneic x-ray showed acute nondisplaced intertrochanteric and subtrochanteric periprosthetic right hip fracture labs showed leukocytosis, COVID-19 done and admitted with orthopedic consultation.  Subjective: Afebrile overnight, blood pressure stable 130s to 160s. Routine labs are stable. Complains of pain but controlled with IV morphine and oral pain medication.  Assessment & Plan:  Acute nondisplaced intertrochanteric and subtrochanteric periprosthetic right hip fracture: Seen by orthopedic who feels that it is stable and operative intervention are not indicated, also waiting for review by Dr. Charlann Boxer, Continue with an NWB RLE and PT/OT and follow-up with Dr. Sherlean Foot on discharge.  Continue pain control, DVT prophylaxis as per orthopedics.  I communicated with Dr. Linna Caprice will be talking with Dr. Charlann Boxer for final recommendation.  Leukocytosis: Resolved.  Alcohol use disorder watch for withdrawal.  Continue with CIWA Ativan, thiamine/folate as he drinks 2-3 drinks per night on average  GERD: On Protonix.   Hyperlipidemia: Continues Lipitor  Essential hypertension: Fairly controlled continue home losartan, amlodipine, HCTZ, metoprolol.  Continue to monitor  15:22 pm Called by Dr Linna Caprice after Dr Charlann Boxer reviewed the fil and advised no surgery- TD WB with walker no abduction for 6 wks and cont dvt prophylaxis with ASA and f/u with Dr Linna Caprice as OP.  Diet Order            Diet regular Room service appropriate?  Yes; Fluid consistency: Thin  Diet effective now               Patient's Body mass index is 27.71 kg/m. DVT prophylaxis: SCD.  Chemical prophylaxis once okay w/ ortho Code Status:   Code Status: Full Code  Family Communication: plan of care discussed with patient at bedside.  Status is: Inpatient  Remains inpatient appropriate because:Inpatient level of care appropriate due to severity of illness and For management of hip fracture  Dispo: The patient is from: Home              Anticipated d/c is to: Home with home health PT/24-hour supervision based on PT OT recommendation patient does not have anybody until Monday to help at home.              Patient currently is not medically stable to d/c.   Difficult to place patient No  Unresulted Labs (From admission, onward)          Start     Ordered   11/14/20 0500  Basic metabolic panel  Daily,   R     Question:  Specimen collection method  Answer:  Lab=Lab collect   11/13/20 1104   11/14/20 0500  CBC  Daily,   R     Question:  Specimen collection method  Answer:  Lab=Lab collect   11/13/20 1104   11/13/20 0500  Calcium, ionized  Tomorrow morning,   R        11/12/20 1108          Medications reviewed:  Scheduled Meds: . amLODipine  5 mg Oral Daily  . atorvastatin  40 mg Oral  Daily  . folic acid  1 mg Oral Daily  . hydrochlorothiazide  25 mg Oral Daily  . LORazepam  0-4 mg Intravenous Q6H   Followed by  . LORazepam  0-4 mg Intravenous Q12H  . losartan  100 mg Oral Daily  . metoprolol succinate  50 mg Oral Daily  . multivitamin with minerals  1 tablet Oral Daily  . senna-docusate  1 tablet Oral BID  . thiamine  100 mg Oral Daily   Or  . thiamine  100 mg Intravenous Daily  . venlafaxine XR  150 mg Oral Q breakfast   Continuous Infusions:  Consultants:see note  Procedures:see note  Antimicrobials: Anti-infectives (From admission, onward)   None     Culture/Microbiology No results found for: SDES, SPECREQUEST,  CULT, REPTSTATUS  Other culture-see note  Objective: Vitals: Today's Vitals   11/14/20 0422 11/14/20 0507 11/14/20 0527 11/14/20 0909  BP:   135/79 (!) 144/79  Pulse:   70 88  Resp:   17 18  Temp:   98.6 F (37 C) 98.2 F (36.8 C)  TempSrc:   Oral Oral  SpO2:   (!) 88% 94%  Weight:      Height:      PainSc: 6  Asleep      Intake/Output Summary (Last 24 hours) at 11/14/2020 1406 Last data filed at 11/13/2020 1600 Gross per 24 hour  Intake --  Output 200 ml  Net -200 ml   Filed Weights   11/12/20 0816  Weight: 95.3 kg   Weight change:   Intake/Output from previous day: 05/20 0701 - 05/21 0700 In: -  Out: 600 [Urine:600] Intake/Output this shift: No intake/output data recorded. Filed Weights   11/12/20 0816  Weight: 95.3 kg    Examination: General exam: AAOx 3 older than stated age, weak appearing. HEENT:Oral mucosa moist, Ear/Nose WNL grossly, dentition normal. Respiratory system: bilaterally clear, no crackles,, no use of accessory muscle Cardiovascular system: S1 & S2 +, No JVD,. Gastrointestinal system: Abdomen soft,NT,ND, BS+ Nervous System:Alert, awake, moving extremities and grossly nonfocal Extremities: no edema, distal peripheral pulses palpable.  Skin: No rashes,no icterus. MSK: Normal muscle bulk,tone, power  Data Reviewed: I have personally reviewed following labs and imaging studies CBC: Recent Labs  Lab 11/12/20 0903 11/13/20 0248 11/14/20 0416  WBC 13.8* 7.9 7.5  NEUTROABS 9.5*  --   --   HGB 12.9* 13.0 13.1  HCT 38.4* 38.5* 38.6*  MCV 94.3 93.9 93.0  PLT 289 212 215   Basic Metabolic Panel: Recent Labs  Lab 11/12/20 0903 11/13/20 0248 11/14/20 0416  NA 138 135 136  K 4.2 3.7 3.8  CL 106 101 100  CO2 22 26 27   GLUCOSE 95 109* 111*  BUN 26* 16 14  CREATININE 1.18 0.97 0.93  CALCIUM 8.8* 8.9 9.2  MG  --  1.8  --   PHOS  --  3.4  --    GFR: Estimated Creatinine Clearance: 89.5 mL/min (by C-G formula based on SCr of 0.93  mg/dL). Liver Function Tests: No results for input(s): AST, ALT, ALKPHOS, BILITOT, PROT, ALBUMIN in the last 168 hours. No results for input(s): LIPASE, AMYLASE in the last 168 hours. No results for input(s): AMMONIA in the last 168 hours. Coagulation Profile: No results for input(s): INR, PROTIME in the last 168 hours. Cardiac Enzymes: No results for input(s): CKTOTAL, CKMB, CKMBINDEX, TROPONINI in the last 168 hours. BNP (last 3 results) No results for input(s): PROBNP in the last 8760 hours. HbA1C:  No results for input(s): HGBA1C in the last 72 hours. CBG: No results for input(s): GLUCAP in the last 168 hours. Lipid Profile: No results for input(s): CHOL, HDL, LDLCALC, TRIG, CHOLHDL, LDLDIRECT in the last 72 hours. Thyroid Function Tests: No results for input(s): TSH, T4TOTAL, FREET4, T3FREE, THYROIDAB in the last 72 hours. Anemia Panel: No results for input(s): VITAMINB12, FOLATE, FERRITIN, TIBC, IRON, RETICCTPCT in the last 72 hours. Sepsis Labs: No results for input(s): PROCALCITON, LATICACIDVEN in the last 168 hours.  Recent Results (from the past 240 hour(s))  Resp Panel by RT-PCR (Flu A&B, Covid) Nasopharyngeal Swab     Status: None   Collection Time: 11/12/20  9:03 AM   Specimen: Nasopharyngeal Swab; Nasopharyngeal(NP) swabs in vial transport medium  Result Value Ref Range Status   SARS Coronavirus 2 by RT PCR NEGATIVE NEGATIVE Final    Comment: (NOTE) SARS-CoV-2 target nucleic acids are NOT DETECTED.  The SARS-CoV-2 RNA is generally detectable in upper respiratory specimens during the acute phase of infection. The lowest concentration of SARS-CoV-2 viral copies this assay can detect is 138 copies/mL. A negative result does not preclude SARS-Cov-2 infection and should not be used as the sole basis for treatment or other patient management decisions. A negative result may occur with  improper specimen collection/handling, submission of specimen other than  nasopharyngeal swab, presence of viral mutation(s) within the areas targeted by this assay, and inadequate number of viral copies(<138 copies/mL). A negative result must be combined with clinical observations, patient history, and epidemiological information. The expected result is Negative.  Fact Sheet for Patients:  BloggerCourse.comhttps://www.fda.gov/media/152166/download  Fact Sheet for Healthcare Providers:  SeriousBroker.ithttps://www.fda.gov/media/152162/download  This test is no t yet approved or cleared by the [place] FDA and  has been authorized for detection and/or diagnosis of SARS-CoV-2 by FDA under an Emergency Use Authorization (EUA). This EUA will remain  in effect (meaning this test can be used) for the duration of the COVID-19 declaration under Section 564(b)(1) of the Act, 21 U.S.C.section 360bbb-3(b)(1), unless the authorization is terminated  or revoked sooner.       Influenza A by PCR NEGATIVE NEGATIVE Final   Influenza B by PCR NEGATIVE NEGATIVE Final    Comment: (NOTE) The Xpert Xpress SARS-CoV-2/FLU/RSV plus assay is intended as an aid in the diagnosis of influenza from Nasopharyngeal swab specimens and should not be used as a sole basis for treatment. Nasal washings and aspirates are unacceptable for Xpert Xpress SARS-CoV-2/FLU/RSV testing.  Fact Sheet for Patients: BloggerCourse.comhttps://www.fda.gov/media/152166/download  Fact Sheet for Healthcare Providers: SeriousBroker.ithttps://www.fda.gov/media/152162/download  This test is not yet approved or cleared by the [place] FDA and has been authorized for detection and/or diagnosis of SARS-CoV-2 by FDA under an Emergency Use Authorization (EUA). This EUA will remain in effect (meaning this test can be used) for the duration of the COVID-19 declaration under Section 564(b)(1) of the Act, 21 U.S.C. section 360bbb-3(b)(1), unless the authorization is terminated or revoked.  Performed at [facility]  Lab, [address] [place], [place] [id number]    Surgical pcr screen     Status: None   Collection Time: 11/12/20  1:48 PM   Specimen: Nasal Mucosa; Nasal Swab  Result Value Ref Range Status   MRSA, PCR NEGATIVE NEGATIVE Final   Staphylococcus aureus NEGATIVE NEGATIVE Final    Comment: (NOTE) The Xpert SA Assay (FDA approved for NASAL specimens in patients 122 years of age and older), is one component of a comprehensive surveillance program. It is not intended to  diagnose infection nor to guide or monitor treatment. Performed at [facility], [address], [place], [place] [address]      Radiology Studies: No results found.   LOS: 2 days   Lanae Boast, MD [facility]  11/14/2020, 2:06 PM

## 2020-11-14 NOTE — Plan of Care (Signed)

## 2020-11-14 NOTE — Evaluation (Signed)
Physical Therapy Evaluation Patient Details Name: Tyler Tate MRN: [id number] DOB: Jun 18, 1956 Today's Date: 11/14/2020   History of Present Illness  65 year old male with HTN, hyperlipidemia, h/o bilateral hip replacement, and alcohol use presented to the ED following a fall. Xray revealed R periprosthesis hip fx. Current plan is for nonoperative Rx. Awaiting additional ortho consult.    Clinical Impression  Pt admitted with above diagnosis. PTA pt active and independent, living at home with his wife. On eval, he required min assist bed mobility, min assist transfers, min guard assist ambulation 10' x 2 with RW, and min assist ascend/descend 2 steps with RW. Cues needed to maintain NWB RLE. Pt currently with functional limitations due to the deficits listed below (see PT Problem List). Pt will benefit from skilled PT to increase their independence and safety with mobility to allow discharge to the venue listed below. Pt's bedroom and full bath are located upstairs in his home. He reports he plans to sleep in his recliner on main level and sponge bathe due to only having access to 1/2 bath.      Follow Up Recommendations Home health PT;Supervision/Assistance - 24 hour    Equipment Recommendations  Rolling walker with 5" wheels;3in1 (PT) (if pt does not have one at home)    Recommendations for Other Services       Precautions / Restrictions Precautions Precautions: Fall Restrictions Weight Bearing Restrictions: Yes RLE Weight Bearing: Non weight bearing      Mobility  Bed Mobility Overal bed mobility: Needs Assistance Bed Mobility: Supine to Sit     Supine to sit: Min assist;HOB elevated     General bed mobility comments: +rail, increased time, assist with RLE, cues for sequencing    Transfers Overall transfer level: Needs assistance Equipment used: Rolling walker (2 wheeled) Transfers: Sit to/from [employer] Sit to Stand: Min assist Stand pivot  transfers: Min guard       General transfer comment: assist to power up, increased time; Pt able to take pivot steps bed to recliner with RW.  Ambulation/Gait Ambulation/Gait assistance: Min guard Gait Distance (Feet): 10 Feet (x 2) Assistive device: Rolling walker (2 wheeled) Gait Pattern/deviations: Step-to pattern;Decreased stride length Gait velocity: decreased Gait velocity interpretation: <1.8 ft/sec, indicate of risk for recurrent falls General Gait Details: cues for NWB RLE. Pt reports he is placing foot on floor but not transfering weight.  Stairs Stairs: Yes Stairs assistance: Min assist Stair Management: No rails;Step to pattern;Backwards;With walker Number of Stairs: 2 General stair comments: cues for sequencing, assist to stabilize RW  Wheelchair Mobility    Modified Rankin (Stroke Patients Only)       Balance Overall balance assessment: Needs assistance Sitting-balance support: No upper extremity supported;Feet supported Sitting balance-Leahy Scale: Good     Standing balance support: Bilateral upper extremity supported;During functional activity Standing balance-Leahy Scale: Poor Standing balance comment: reliant on BUE support                             Pertinent Vitals/Pain Pain Assessment: 0-10 Pain Score: 4  Pain Location: R hip Pain Descriptors / Indicators: Sore;Discomfort;Spasm Pain Intervention(s): Monitored during session;Repositioned;Patient requesting pain meds-RN notified    Home Living Family/patient expects to be discharged to:: Private residence Living Arrangements: Spouse/significant other Available Help at Discharge: Family;Available 24 hours/day Type of Home: House Home Access: Stairs to enter Entrance Stairs-Rails: None Entrance Stairs-Number of Steps: 2 Home Layout: Bed/bath upstairs;1/2 bath on main  level;Two level Home Equipment: Crutches Additional Comments: Pt unsure if he still has RW from previous hip sx.     Prior Function Level of Independence: Independent               Hand Dominance   Dominant Hand: Right    Extremity/Trunk Assessment   Upper Extremity Assessment Upper Extremity Assessment: Overall WFL for tasks assessed    Lower Extremity Assessment Lower Extremity Assessment: RLE deficits/detail RLE Deficits / Details: periprosthetic hip fx RLE: Unable to fully assess due to pain    Cervical / Trunk Assessment Cervical / Trunk Assessment: Normal  Communication   Communication: No difficulties  Cognition Arousal/Alertness: Awake/alert Behavior During Therapy: WFL for tasks assessed/performed Overall Cognitive Status: Within Functional Limits for tasks assessed                                        General Comments General comments (skin integrity, edema, etc.): VSS on RA    Exercises     Assessment/Plan    PT Assessment Patient needs continued PT services  PT Problem List Decreased strength;Decreased mobility;Decreased knowledge of precautions;Decreased activity tolerance;Pain;Decreased balance;Decreased knowledge of use of DME       PT Treatment Interventions DME instruction;Therapeutic activities;Gait training;Therapeutic exercise;Patient/family education;Balance training;Stair training;Functional mobility training    PT Goals (Current goals can be found in the Care Plan section)  Acute Rehab PT Goals Patient Stated Goal: regain independence PT Goal Formulation: With patient Time For Goal Achievement: 11/28/20 Potential to Achieve Goals: Good    Frequency Min 6X/week   Barriers to discharge        Co-evaluation               AM-PAC PT "6 Clicks" Mobility  Outcome Measure Help needed turning from your back to your side while in a flat bed without using bedrails?: A Little Help needed moving from lying on your back to sitting on the side of a flat bed without using bedrails?: A Little Help needed moving to and from a bed to  a chair (including a wheelchair)?: A Little Help needed standing up from a chair using your arms (e.g., wheelchair or bedside chair)?: A Little Help needed to walk in hospital room?: A Little Help needed climbing 3-5 steps with a railing? : A Little 6 Click Score: 18    End of Session Equipment Utilized During Treatment: Gait belt Activity Tolerance: Patient tolerated treatment well Patient left: in chair;with call bell/phone within reach Nurse Communication: Mobility status;Patient requests pain meds PT Visit Diagnosis: Other abnormalities of gait and mobility (R26.89);Difficulty in walking, not elsewhere classified (R26.2);Pain Pain - Right/Left: Right Pain - part of body: Hip    Time: 2376-2831 PT Time Calculation (min) (ACUTE ONLY): 49 min   Charges:   PT Evaluation $PT Eval Moderate Complexity: 1 Mod PT Treatments $Gait Training: 23-37 mins        Aida Raider, PT  Office # [phone] Pager [phone]   Ilda Foil 11/14/2020, 11:37 AM

## 2020-11-14 NOTE — Plan of Care (Signed)
  Problem: Health Behavior/Discharge Planning: Goal: Ability to manage health-related needs will improve Outcome: Progressing   Problem: Clinical Measurements: Goal: Will remain free from infection Outcome: Progressing   Problem: Activity: Goal: Risk for activity intolerance will decrease Outcome: Progressing   

## 2020-11-14 NOTE — Progress Notes (Signed)
Occupational Therapy Evaluation Patient Details Name: Tyler Tate MRN: [id number] DOB: October 21, 1955 Today's Date: 11/14/2020    History of Present Illness 65 year old male with HTN, hyperlipidemia, h/o bilateral hip replacement, and alcohol use presented to the ED following a fall. Xray revealed R periprosthesis hip fx. Current plan is for nonoperative Rx. Awaiting additional ortho consult.   Clinical Impression   Pt received with above diagnosis. PTA pt PLOF living at home with wife, I with all ADLs and IADLs, employed and still driving. Pt reports living in a 2 level home with no AE. Pt reports ability to live on main level during recovery due to NWB status and limitation to mobilize on flight of steps. Pt currently min guard to min A with sit to stand transitions for toilet transfers and required assistance with LB dressing RLE due to pain and limited ROM. Pt will benefit from continued acute OT until able to safely return to home setting. Reports wife currently having the flu and concerns of limited support due to her condition. Pt will benefit from [facility] once d/c to ensure safety in home environment as well as increased independence with daily routines.     Follow Up Recommendations  Home health OT    Equipment Recommendations  3 in 1 bedside commode    Recommendations for Other Services       Precautions / Restrictions Precautions Precautions: Fall Restrictions Weight Bearing Restrictions: Yes RLE Weight Bearing: Non weight bearing      Mobility Bed Mobility Overal bed mobility: Needs Assistance Bed Mobility: Supine to Sit     Supine to sit: Min assist;HOB elevated     General bed mobility comments: received in chair upon arrival.    Transfers Overall transfer level: Needs assistance Equipment used: Rolling walker (2 wheeled) Transfers: Sit to/from [employer] Sit to Stand: Min assist Stand pivot transfers: Min guard       General transfer  comment: assist to power up, increased time; toilet transfer    Balance Overall balance assessment: Needs assistance Sitting-balance support: No upper extremity supported;Feet supported Sitting balance-Leahy Scale: Good     Standing balance support: Bilateral upper extremity supported;During functional activity Standing balance-Leahy Scale: Poor Standing balance comment: reliant on BUE support                           ADL either performed or assessed with clinical judgement   ADL Overall ADL's : Needs assistance/impaired Eating/Feeding: Set up;Sitting   Grooming: Oral care;Wash/dry face;Wash/dry hands;Set up;Minimal assistance;Standing   Upper Body Bathing: Modified independent;Sitting   Lower Body Bathing: Minimal assistance;Sitting/lateral leans   Upper Body Dressing : Modified independent;Sitting   Lower Body Dressing: Minimal assistance;With adaptive equipment;Sit to/from stand;Adhering to hip precautions   Toilet Transfer: Minimal assistance;Cueing for sequencing;Adhering to hip precautions;Ambulation;BSC;RW Toilet Transfer Details (indicate cue type and reason): toilet transfer from recliner to [facility] over toilet to assess functional transfer and toilet hygiene.         Functional mobility during ADLs: Minimal assistance;Rolling walker General ADL Comments: good adherence to hip precautions, pt limited with safety awareness to make sure to feel seat behind legs prior to sitting.     Vision         Perception     Praxis      Pertinent Vitals/Pain Pain Assessment: 0-10 Pain Score: 3  Pain Location: R hip Pain Descriptors / Indicators: Sore;Discomfort;Spasm Pain Intervention(s): Limited activity within patient's tolerance;Monitored during session;Repositioned;Premedicated before  session     Hand Dominance Right   Extremity/Trunk Assessment Upper Extremity Assessment Upper Extremity Assessment: Overall WFL for tasks assessed   Lower Extremity  Assessment Lower Extremity Assessment: Defer to PT evaluation RLE Deficits / Details: periprosthetic hip fx RLE: Unable to fully assess due to pain   Cervical / Trunk Assessment Cervical / Trunk Assessment: Normal   Communication Communication Communication: No difficulties   Cognition Arousal/Alertness: Awake/alert Behavior During Therapy: WFL for tasks assessed/performed Overall Cognitive Status: Within Functional Limits for tasks assessed                                     General Comments  VSS on RA    Exercises     Shoulder Instructions      Home Living Family/patient expects to be discharged to:: Private residence Living Arrangements: Spouse/significant other Available Help at Discharge: Family;Available 24 hours/day Type of Home: House Home Access: Stairs to enter [employer] of Steps: 2 Entrance Stairs-Rails: None Home Layout: Bed/bath upstairs;1/2 bath on main level;Two level Alternate Level Stairs-Number of Steps: flight   Bathroom Shower/Tub: Walk-in shower (reports built in Tour manager no grab bars.)   Bathroom Toilet: Standard     Home Equipment: Crutches   Additional Comments: Pt unsure if he still has RW from previous hip sx.      Prior Functioning/Environment Level of Independence: Independent                 OT Problem List: Impaired balance (sitting and/or standing);Decreased safety awareness;Decreased knowledge of use of DME or AE;Pain      OT Treatment/Interventions: Self-care/ADL training;Therapeutic exercise;DME and/or AE instruction;Therapeutic activities;Patient/family education;Balance training    OT Goals(Current goals can be found in the care plan section) Acute Rehab OT Goals Patient Stated Goal: regain independence OT Goal Formulation: With patient Time For Goal Achievement: 11/28/20 Potential to Achieve Goals: Good  OT Frequency: Min 1X/week   Barriers to D/C:            Co-evaluation               AM-PAC OT "6 Clicks" Daily Activity     Outcome Measure Help from another person eating meals?: A Little Help from another person taking care of personal grooming?: A Little Help from another person toileting, which includes using toliet, bedpan, or urinal?: A Little Help from another person bathing (including washing, rinsing, drying)?: A Little Help from another person to put on and taking off regular upper body clothing?: A Little Help from another person to put on and taking off regular lower body clothing?: A Lot 6 Click Score: 17   End of Session Equipment Utilized During Treatment: Gait belt;Rolling walker Nurse Communication: Mobility status;Weight bearing status  Activity Tolerance: Patient limited by pain Patient left: in chair;with call bell/phone within reach  OT Visit Diagnosis: Muscle weakness (generalized) (M62.81);History of falling (Z91.81);Pain                Time: [id number] OT Time Calculation (min): 22 min Charges:  OT General Charges $OT Visit: 1 Visit OT Evaluation $OT Eval Low Complexity: 1 Low  [place] Old, MSOT, OTR/L  Supplemental Rehabilitation Services  [phone]   Zigmund Daniel 11/14/2020, 2:58 PM

## 2020-11-15 DIAGNOSIS — S72001A Fracture of unspecified part of neck of right femur, initial encounter for closed fracture: Secondary | ICD-10-CM | POA: Diagnosis not present

## 2020-11-15 LAB — BASIC METABOLIC PANEL
Anion gap: 12 (ref 5–15)
BUN: 23 mg/dL (ref 8–23)
CO2: 26 mmol/L (ref 22–32)
Calcium: 9.2 mg/dL (ref 8.9–10.3)
Chloride: 97 mmol/L — ABNORMAL LOW (ref 98–111)
Creatinine, Ser: 1.29 mg/dL — ABNORMAL HIGH (ref 0.61–1.24)
GFR, Estimated: >60 mL/min (ref >60)
Glucose, Bld: 119 mg/dL — ABNORMAL HIGH (ref 70–99)
Potassium: 4.7 mmol/L (ref 3.5–5.1)
Sodium: 135 mmol/L (ref 135–145)

## 2020-11-15 LAB — CBC
HCT: 38 % — ABNORMAL LOW (ref 39.0–52.0)
Hemoglobin: 12.9 g/dL — ABNORMAL LOW (ref 13.0–17.0)
MCH: 31.8 pg (ref 26.0–34.0)
MCHC: 33.9 g/dL (ref 30.0–36.0)
MCV: 93.6 fL (ref 80.0–100.0)
Platelets: 217 10*3/uL (ref 150–400)
RBC: 4.06 MIL/uL — ABNORMAL LOW (ref 4.22–5.81)
RDW: 12.1 % (ref 11.5–15.5)
WBC: 8.1 10*3/uL (ref 4.0–10.5)
nRBC: 0 % (ref 0.0–0.2)

## 2020-11-15 LAB — CALCIUM, IONIZED: Calcium, Ionized, Serum: 4.9 mg/dL (ref 4.5–5.6)

## 2020-11-15 MED ORDER — PANTOPRAZOLE SODIUM 40 MG PO TBEC
40.0000 mg | DELAYED_RELEASE_TABLET | Freq: Every day | ORAL | Status: DC
  Administered 2020-11-15 – 2020-11-16 (×2): 40 mg via ORAL
  Filled 2020-11-15 (×2): qty 1

## 2020-11-15 MED ORDER — ASPIRIN EC 81 MG PO TBEC
81.0000 mg | DELAYED_RELEASE_TABLET | Freq: Two times a day (BID) | ORAL | Status: DC
  Administered 2020-11-15 – 2020-11-16 (×3): 81 mg via ORAL
  Filled 2020-11-15 (×3): qty 1

## 2020-11-15 NOTE — Progress Notes (Signed)
PROGRESS NOTE    Tyler Tate  DJT:[id number] DOB: 12-Feb-1956 DOA: 11/12/2020 PCP: Geoffry Paradise, MD   Chief Complaint  Patient presents with  . Hip Pain  Brief Narrative: 65 year old male with hypertension hyperlipidemia bilateral hip replacement and alcohol use presented with a fall last night and having right hip pain since then.  He tripped over an end table at his daughter's house without head injury or loss of consciousness. He was seen in the ED vitals stable mildly tachypneic x-ray showed acute nondisplaced intertrochanteric and subtrochanteric periprosthetic right hip fracture labs showed leukocytosis, COVID-19 done and admitted with orthopedic consultation.  Seen by orthopedic surgery by Dr. Linna Caprice and Dr. Charlann Boxer I discussed with Dr Linna Caprice personally and  they do not advise any surgical intervention at this time continue touchdown weightbearing with walker no abduction for 6 weeks, pain control  Subjective: Seen this morning resting in the bedside chair PT was in early and will come back to work with him again No acute events.  Creatinine slightly up. continue use on pain management Still complains of pain  Assessment & Plan:  Acute nondisplaced intertrochanteric and subtrochanteric periprosthetic right hip fracture: Seen by orthopedic who feels that it is stable and operative intervention are not indicated. continue touchdown weightbearing with walker no abduction for 6 weeks, pain contro, DVT prophylaxis with aspirin 81 mg.  Patient will continue pain management, follow-up with orthopedics as outpatient.  I have communicated this recommendation from orthopedics and he has agreed to follow-up with orthopedics as outpatient.  We will have PT work with him and educate on TD WBAT with walker and no abduction for 6 weeks  Leukocytosis: Resolved. Mildly increased creatinine encourage oral hydration  Alcohol use disorder no signs of withdrawal.Continue with CIWA Ativan,  thiamine/folate as he drinks 2-3 drinks per night on average  GERD: Continue Protonix.   Hyperlipidemia: on Lipitor  Essential hypertension: Stable home losartan ( hold 2/2 mild hypkalemia), cont amlodipine, HCTZ, metoprolol.  Continue to monitor  Diet Order            Diet regular Room service appropriate? Yes; Fluid consistency: Thin  Diet effective now               Patient's Body mass index is 27.71 kg/m. DVT prophylaxis: SCD.  Chemical prophylaxis once okay w/ ortho Code Status:   Code Status: Full Code  Family Communication: plan of care discussed with patient at bedside.  Status is: Inpatient  Remains inpatient appropriate because:Inpatient level of care appropriate due to severity of illness and For management of hip fracture  Dispo: The patient is from: Home              Anticipated d/c is to: Home with home health PT/24-hour supervision based on PT OT recommendation patient does not have anybody until Monday to help at home.              Patient currently is medically stable   Difficult to place patient No  Unresulted Labs (From admission, onward)          Start     Ordered   11/14/20 0500  Basic metabolic panel  Daily,   R     Question:  Specimen collection method  Answer:  Lab=Lab collect   11/13/20 1104   11/14/20 0500  CBC  Daily,   R     Question:  Specimen collection method  Answer:  Lab=Lab collect   11/13/20 1104  Medications reviewed:  Scheduled Meds: . amLODipine  5 mg Oral Daily  . atorvastatin  40 mg Oral Daily  . folic acid  1 mg Oral Daily  . hydrochlorothiazide  25 mg Oral Daily  . LORazepam  0-4 mg Intravenous Q12H  . losartan  100 mg Oral Daily  . metoprolol succinate  50 mg Oral Daily  . multivitamin with minerals  1 tablet Oral Daily  . senna-docusate  1 tablet Oral BID  . thiamine  100 mg Oral Daily   Or  . thiamine  100 mg Intravenous Daily  . venlafaxine XR  150 mg Oral Q breakfast   Continuous  Infusions:  Consultants:see note  Procedures:see note  Antimicrobials: Anti-infectives (From admission, onward)   None     Culture/Microbiology No results found for: SDES, SPECREQUEST, CULT, REPTSTATUS  Other culture-see note  Objective: Vitals: Today's Vitals   11/14/20 2154 11/14/20 2157 11/15/20 0348 11/15/20 0651  BP: 132/72  140/80   Pulse: 78  72   Resp: 15  16   Temp: 98.4 F (36.9 C)  98.7 F (37.1 C)   TempSrc: Oral  Oral   SpO2: 96%  95%   Weight:      Height:      PainSc:  Asleep  4     Intake/Output Summary (Last 24 hours) at 11/15/2020 0756 Last data filed at 11/15/2020 0700 Gross per 24 hour  Intake 360 ml  Output 600 ml  Net -240 ml   Filed Weights   11/12/20 0816  Weight: 95.3 kg   Weight change:   Intake/Output from previous day: 05/21 0701 - 05/22 0700 In: 360 [P.O.:360] Out: 600 [Urine:600] Intake/Output this shift: No intake/output data recorded. Filed Weights   11/12/20 0816  Weight: 95.3 kg    Examination: General exam: AAOx 3 older than stated age, weak appearing. HEENT:Oral mucosa moist, Ear/Nose WNL grossly, dentition normal. Respiratory system: bilaterally clear without crackles, no use of accessory muscle Cardiovascular system: S1 & S2 +, No JVD,. Gastrointestinal system: Abdomen soft,NT,ND, BS+ Nervous System:Alert, awake, moving extremities and grossly nonfocal Extremities: no edema, distal peripheral pulses palpable.  Skin: No rashes,no icterus. MSK: Normal muscle bulk,tone, power  Data Reviewed: I have personally reviewed following labs and imaging studies CBC: Recent Labs  Lab 11/12/20 0903 11/13/20 0248 11/14/20 0416 11/15/20 0228  WBC 13.8* 7.9 7.5 8.1  NEUTROABS 9.5*  --   --   --   HGB 12.9* 13.0 13.1 12.9*  HCT 38.4* 38.5* 38.6* 38.0*  MCV 94.3 93.9 93.0 93.6  PLT 289 212 215 217   Basic Metabolic Panel: Recent Labs  Lab 11/12/20 0903 11/13/20 0248 11/14/20 0416 11/15/20 0228  NA 138 135 136 135   K 4.2 3.7 3.8 4.7  CL 106 101 100 97*  CO2 22 26 27 26   GLUCOSE 95 109* 111* 119*  BUN 26* 16 14 23   CREATININE 1.18 0.97 0.93 1.29*  CALCIUM 8.8* 8.9 9.2 9.2  MG  --  1.8  --   --   PHOS  --  3.4  --   --    GFR: Estimated Creatinine Clearance: 64.5 mL/min (A) (by C-G formula based on SCr of 1.29 mg/dL (H)). Liver Function Tests: No results for input(s): AST, ALT, ALKPHOS, BILITOT, PROT, ALBUMIN in the last 168 hours. No results for input(s): LIPASE, AMYLASE in the last 168 hours. No results for input(s): AMMONIA in the last 168 hours. Coagulation Profile: No results for input(s): INR, PROTIME in  the last 168 hours. Cardiac Enzymes: No results for input(s): CKTOTAL, CKMB, CKMBINDEX, TROPONINI in the last 168 hours. BNP (last 3 results) No results for input(s): PROBNP in the last 8760 hours. HbA1C: No results for input(s): HGBA1C in the last 72 hours. CBG: No results for input(s): GLUCAP in the last 168 hours. Lipid Profile: No results for input(s): CHOL, HDL, LDLCALC, TRIG, CHOLHDL, LDLDIRECT in the last 72 hours. Thyroid Function Tests: No results for input(s): TSH, T4TOTAL, FREET4, T3FREE, THYROIDAB in the last 72 hours. Anemia Panel: No results for input(s): VITAMINB12, FOLATE, FERRITIN, TIBC, IRON, RETICCTPCT in the last 72 hours. Sepsis Labs: No results for input(s): PROCALCITON, LATICACIDVEN in the last 168 hours.  Recent Results (from the past 240 hour(s))  Resp Panel by RT-PCR (Flu A&B, Covid) Nasopharyngeal Swab     Status: None   Collection Time: 11/12/20  9:03 AM   Specimen: Nasopharyngeal Swab; Nasopharyngeal(NP) swabs in vial transport medium  Result Value Ref Range Status   SARS Coronavirus 2 by RT PCR NEGATIVE NEGATIVE Final    Comment: (NOTE) SARS-CoV-2 target nucleic acids are NOT DETECTED.  The SARS-CoV-2 RNA is generally detectable in upper respiratory specimens during the acute phase of infection. The lowest concentration of SARS-CoV-2 viral copies  this assay can detect is 138 copies/mL. A negative result does not preclude SARS-Cov-2 infection and should not be used as the sole basis for treatment or other patient management decisions. A negative result may occur with  improper specimen collection/handling, submission of specimen other than nasopharyngeal swab, presence of viral mutation(s) within the areas targeted by this assay, and inadequate number of viral copies(<138 copies/mL). A negative result must be combined with clinical observations, patient history, and epidemiological information. The expected result is Negative.  Fact Sheet for Patients:  BloggerCourse.comhttps://www.fda.gov/media/152166/download  Fact Sheet for Healthcare Providers:  SeriousBroker.ithttps://www.fda.gov/media/152162/download  This test is no t yet approved or cleared by the [place] FDA and  has been authorized for detection and/or diagnosis of SARS-CoV-2 by FDA under an Emergency Use Authorization (EUA). This EUA will remain  in effect (meaning this test can be used) for the duration of the COVID-19 declaration under Section 564(b)(1) of the Act, 21 U.S.C.section 360bbb-3(b)(1), unless the authorization is terminated  or revoked sooner.       Influenza A by PCR NEGATIVE NEGATIVE Final   Influenza B by PCR NEGATIVE NEGATIVE Final    Comment: (NOTE) The Xpert Xpress SARS-CoV-2/FLU/RSV plus assay is intended as an aid in the diagnosis of influenza from Nasopharyngeal swab specimens and should not be used as a sole basis for treatment. Nasal washings and aspirates are unacceptable for Xpert Xpress SARS-CoV-2/FLU/RSV testing.  Fact Sheet for Patients: BloggerCourse.comhttps://www.fda.gov/media/152166/download  Fact Sheet for Healthcare Providers: SeriousBroker.ithttps://www.fda.gov/media/152162/download  This test is not yet approved or cleared by the [place] FDA and has been authorized for detection and/or diagnosis of SARS-CoV-2 by FDA under an Emergency Use Authorization (EUA). This EUA  will remain in effect (meaning this test can be used) for the duration of the COVID-19 declaration under Section 564(b)(1) of the Act, 21 U.S.C. section 360bbb-3(b)(1), unless the authorization is terminated or revoked.  Performed at [facility], [address] [place], [place] [address]   Surgical pcr screen     Status: None   Collection Time: 11/12/20  1:48 PM   Specimen: Nasal Mucosa; Nasal Swab  Result Value Ref Range Status   MRSA, PCR NEGATIVE NEGATIVE Final   Staphylococcus aureus NEGATIVE NEGATIVE Final  Comment: (NOTE) The Xpert SA Assay (FDA approved for NASAL specimens in patients 18 years of age and older), is one component of a comprehensive surveillance program. It is not intended to diagnose infection nor to guide or monitor treatment. Performed at [facility], [address], [place], [place] [address]      Radiology Studies: No results found.   LOS: 3 days   Lanae Boast, MD [facility]  11/15/2020, 7:56 AM

## 2020-11-15 NOTE — Progress Notes (Signed)
Physical Therapy Treatment Patient Details Name: Tyler Tate MRN: [id number] DOB: 1956-02-04 Today's Date: 11/15/2020    History of Present Illness 65 year old male with HTN, hyperlipidemia, h/o bilateral hip replacement, and alcohol use presented to the ED following a fall. Xray revealed R periprosthesis hip fx. Plan is for nonoperative Rx.    PT Comments    Pt received in recliner, reporting fatigue/lethargy from not sleeping well last night. He required min guard assist transfers and ambulation 30' with RW. Distance limited by pain and fatigue. Cues needed for TDWB RLE. Educated pt on new MD order for no abduction x 6 weeks. Verbally reviewed stair training with RW. Handout provided. Pt returned to recliner at end of session with feet elevated.    Follow Up Recommendations  Home health PT;Supervision/Assistance - 24 hour     Equipment Recommendations  Rolling walker with 5" wheels;3in1 (PT) (if pt does not have one at home)    Recommendations for Other Services       Precautions / Restrictions Precautions Precautions: Fall Restrictions RLE Weight Bearing: Touchdown weight bearing Other Position/Activity Restrictions: no abduction x 6 weeks    Mobility  Bed Mobility               General bed mobility comments: received in chair upon arrival.    Transfers Overall transfer level: Needs assistance Equipment used: Rolling walker (2 wheeled) Transfers: Sit to/from [employer] Sit to Stand: Min guard Stand pivot transfers: Min guard       General transfer comment: increased time and effort, supervision for safety  Ambulation/Gait Ambulation/Gait assistance: Min guard Gait Distance (Feet): 30 Feet Assistive device: Rolling walker (2 wheeled) Gait Pattern/deviations: Step-to pattern;Decreased stride length;Antalgic Gait velocity: decreased Gait velocity interpretation: <1.8 ft/sec, indicate of risk for recurrent falls General Gait Details: cues  for TDWB RLE. Distance limited by pain and fatigue.   Stairs Stairs:  (Verbally reviewed stair training. Pt declining need to practice. Verbalizes understanding of technique. Handout provided.)           Wheelchair Mobility    Modified Rankin (Stroke Patients Only)       Balance Overall balance assessment: Needs assistance Sitting-balance support: No upper extremity supported;Feet supported Sitting balance-Leahy Scale: Good     Standing balance support: Bilateral upper extremity supported;During functional activity Standing balance-Leahy Scale: Poor Standing balance comment: reliant on BUE support                            Cognition Arousal/Alertness: Awake/alert Behavior During Therapy: WFL for tasks assessed/performed Overall Cognitive Status: Within Functional Limits for tasks assessed                                        Exercises      General Comments General comments (skin integrity, edema, etc.): VSS on RA      Pertinent Vitals/Pain Pain Assessment: 0-10 Pain Score: 5  Pain Location: R hip Pain Descriptors / Indicators: Grimacing;Discomfort;Aching Pain Intervention(s): Limited activity within patient's tolerance;Monitored during session;Repositioned;Patient requesting pain meds-RN notified    Home Living                      Prior Function            PT Goals (current goals can now be found in the care plan section) Acute Rehab  PT Goals Patient Stated Goal: regain independence Progress towards PT goals: Progressing toward goals    Frequency    Min 6X/week      PT Plan Current plan remains appropriate    Co-evaluation              AM-PAC PT "6 Clicks" Mobility   Outcome Measure  Help needed turning from your back to your side while in a flat bed without using bedrails?: A Little Help needed moving from lying on your back to sitting on the side of a flat bed without using bedrails?: A  Little Help needed moving to and from a bed to a chair (including a wheelchair)?: A Little Help needed standing up from a chair using your arms (e.g., wheelchair or bedside chair)?: A Little Help needed to walk in hospital room?: A Little Help needed climbing 3-5 steps with a railing? : A Little 6 Click Score: 18    End of Session Equipment Utilized During Treatment: Gait belt Activity Tolerance: Patient tolerated treatment well Patient left: in chair;with call bell/phone within reach Nurse Communication: Mobility status;Patient requests pain meds PT Visit Diagnosis: Other abnormalities of gait and mobility (R26.89);Difficulty in walking, not elsewhere classified (R26.2);Pain Pain - Right/Left: Right Pain - part of body: Hip     Time: 2423-5361 PT Time Calculation (min) (ACUTE ONLY): 14 min  Charges:  $Gait Training: 8-22 mins                     Tyler Tate, PT  Office # [phone] Pager [phone]    Ilda Foil 11/15/2020, 11:33 AM

## 2020-11-15 NOTE — Plan of Care (Signed)
  Problem: Health Behavior/Discharge Planning: Goal: Ability to manage health-related needs will improve Outcome: Progressing   Problem: Clinical Measurements: Goal: Will remain free from infection Outcome: Progressing   Problem: Pain Managment: Goal: General experience of comfort will improve Outcome: Progressing   

## 2020-11-16 DIAGNOSIS — S72001A Fracture of unspecified part of neck of right femur, initial encounter for closed fracture: Secondary | ICD-10-CM | POA: Diagnosis not present

## 2020-11-16 LAB — CBC
HCT: 38.7 % — ABNORMAL LOW (ref 39.0–52.0)
Hemoglobin: 13.2 g/dL (ref 13.0–17.0)
MCH: 31.5 pg (ref 26.0–34.0)
MCHC: 34.1 g/dL (ref 30.0–36.0)
MCV: 92.4 fL (ref 80.0–100.0)
Platelets: 229 10*3/uL (ref 150–400)
RBC: 4.19 MIL/uL — ABNORMAL LOW (ref 4.22–5.81)
RDW: 12 % (ref 11.5–15.5)
WBC: 6.5 10*3/uL (ref 4.0–10.5)
nRBC: 0 % (ref 0.0–0.2)

## 2020-11-16 LAB — BASIC METABOLIC PANEL
Anion gap: 10 (ref 5–15)
BUN: 26 mg/dL — ABNORMAL HIGH (ref 8–23)
CO2: 27 mmol/L (ref 22–32)
Calcium: 9 mg/dL (ref 8.9–10.3)
Chloride: 97 mmol/L — ABNORMAL LOW (ref 98–111)
Creatinine, Ser: 1.21 mg/dL (ref 0.61–1.24)
GFR, Estimated: >60 mL/min (ref >60)
Glucose, Bld: 119 mg/dL — ABNORMAL HIGH (ref 70–99)
Potassium: 4 mmol/L (ref 3.5–5.1)
Sodium: 134 mmol/L — ABNORMAL LOW (ref 135–145)

## 2020-11-16 MED ORDER — HYDROCODONE-ACETAMINOPHEN 5-325 MG PO TABS: 1.0000 | ORAL_TABLET | Freq: Four times a day (QID) | ORAL | 0 refills | Status: AC | PRN

## 2020-11-16 MED ORDER — METHOCARBAMOL 500 MG PO TABS: 500.0000 mg | ORAL_TABLET | Freq: Four times a day (QID) | ORAL | 0 refills | Status: DC | PRN

## 2020-11-16 MED ORDER — ASPIRIN 81 MG PO TBEC: 81.0000 mg | DELAYED_RELEASE_TABLET | Freq: Two times a day (BID) | ORAL | 0 refills | Status: AC

## 2020-11-16 MED ORDER — OMEPRAZOLE 20 MG PO CPDR: 20.0000 mg | DELAYED_RELEASE_CAPSULE | Freq: Every day | ORAL | 0 refills | Status: AC

## 2020-11-16 NOTE — Progress Notes (Signed)
Discharge instructions reviewed with pt by SWOT RN Gabe.  Copy of instructions given to pt. Pt informed scripts at his pharmacy. Pt waiting for equipment to arrive to his room (BSC/3IN1 and for wife to arrive to pick him up.

## 2020-11-16 NOTE — Progress Notes (Signed)
Pt d/c'd via wheelchair with belongings, wife at main entrance.          Escorted by unit staff.

## 2020-11-16 NOTE — Care Management Important Message (Signed)
Important Message  Patient Details  Name: Tyler Tate MRN: [id number] Date of Birth: 08/08/1955   Medicare Important Message Given:  Yes     Tyler Tate P Deardra Hinkley 11/16/2020, 2:23 PM

## 2020-11-16 NOTE — Progress Notes (Signed)
Physical Therapy Treatment Patient Details Name: Tyler Tate MRN: [id number] DOB: December 20, 1955 Today's Date: 11/16/2020    History of Present Illness 65 year old male with HTN, hyperlipidemia, h/o bilateral hip replacement, and alcohol use presented to the ED following a fall. Xray revealed R periprosthesis hip fx. Plan is for nonoperative Rx.    PT Comments    Pt very irritated due to lack of sleep and reports "I am really not in the mood". Went through [employer] for R LE, re-iterated no R hip ABDuction for 6 weeks and what that looks like for getting in/out of bed. PT demonstrated stair negotiation with unilateral crutch and one handrail to go upstairs to his shower. Acute PT to cont to follow to progress indep mobility.    Follow Up Recommendations  Home health PT;Supervision/Assistance - 24 hour     Equipment Recommendations  Rolling walker with 5" wheels;3in1 (PT)    Recommendations for Other Services       Precautions / Restrictions Precautions Precautions: Fall Restrictions Weight Bearing Restrictions: Yes RLE Weight Bearing: Touchdown weight bearing Other Position/Activity Restrictions: no abduction x 6 weeks    Mobility  Bed Mobility               General bed mobility comments: pt up in recliner,    Transfers Overall transfer level: Needs assistance Equipment used: Rolling walker (2 wheeled) Transfers: Sit to/from [employer] Sit to Stand: Min guard         General transfer comment: increased time, appears to put more like 25% PWB on R LE during standing, once up in standing pt able to maintain R LE TWB. Pt declined ambulation or stairs.  Ambulation/Gait             General Gait Details: pt declined x 3 despite max encouragement   Stairs Stairs: Yes       General stair comments: PT demonstrated how to use R unilateral crutch to ascend steps with L rail to mimic home set up and descend with R rail and L unilateral crutch. Pt  refused to trial it but was able to verbalize proper sequencing to PT. PT demonstrated x 3. Pt able to re-iterate "UP with the good (L), down with the (R)"   Wheelchair Mobility    Modified Rankin (Stroke Patients Only)       Balance Overall balance assessment: Needs assistance Sitting-balance support: No upper extremity supported;Feet supported Sitting balance-Leahy Scale: Good     Standing balance support: Bilateral upper extremity supported;During functional activity Standing balance-Leahy Scale: Poor Standing balance comment: reliant on BUE support                            Cognition Arousal/Alertness: Awake/alert Behavior During Therapy:  (irritated and ready to go home) Overall Cognitive Status: Within Functional Limits for tasks assessed                                 General Comments: pt reports "I am not in the mood today.I haven't slept. I am not getting up just tell me how to do things."      Exercises General Exercises - Lower Extremity Ankle Circles/Pumps: AROM;Both;5 reps;Seated Quad Sets: AROM;Right;5 reps (seatead with LEs elevated) Gluteal Sets: AROM;Both;10 reps;Seated Long Arc Quad: AROM;Right;5 reps;Seated Heel Slides: AROM;Right;10 reps;Seated (with towel under foot to slide over hard floor) Hip Flexion/Marching: AAROM;Right;5 reps;Seated;Standing (completed  with 50% assist for LE weigth in both sitting and standing)    General Comments General comments (skin integrity, edema, etc.): VSS on RA however with noted significant coughing      Pertinent Vitals/Pain Pain Assessment: 0-10 Pain Score: 5  Pain Location: R hip Pain Descriptors / Indicators: Tightness Pain Intervention(s): Limited activity within patient's tolerance    Home Living                      Prior Function            PT Goals (current goals can now be found in the care plan section) Acute Rehab PT Goals Patient Stated Goal: home this  morning Progress towards PT goals: Progressing toward goals    Frequency    Min 6X/week      PT Plan Current plan remains appropriate    Co-evaluation              AM-PAC PT "6 Clicks" Mobility   Outcome Measure  Help needed turning from your back to your side while in a flat bed without using bedrails?: A Little Help needed moving from lying on your back to sitting on the side of a flat bed without using bedrails?: A Little Help needed moving to and from a bed to a chair (including a wheelchair)?: A Little Help needed standing up from a chair using your arms (e.g., wheelchair or bedside chair)?: A Little Help needed to walk in hospital room?: A Little Help needed climbing 3-5 steps with a railing? : A Little 6 Click Score: 18    End of Session Equipment Utilized During Treatment: Gait belt Activity Tolerance: Patient limited by fatigue Patient left: in chair;with call bell/phone within reach Nurse Communication: Mobility status;Patient requests pain meds PT Visit Diagnosis: Other abnormalities of gait and mobility (R26.89);Difficulty in walking, not elsewhere classified (R26.2);Pain Pain - Right/Left: Right Pain - part of body: Hip     Time: 4163-8453 PT Time Calculation (min) (ACUTE ONLY): 42 min  Charges:  $Gait Training: 8-22 mins $Therapeutic Exercise: 8-22 mins                     [name], PT, DPT Acute Rehabilitation Services Pager #: [phone] Office #: [phone]    Iona Hansen 11/16/2020, 8:59 AM

## 2020-11-16 NOTE — Progress Notes (Signed)
Occupational Therapy Treatment Patient Details Name: Tyler Tate MRN: [id number] DOB: Oct 17, 1955 Today's Date: 11/16/2020    History of present illness 65 year old male with HTN, hyperlipidemia, h/o bilateral hip replacement, and alcohol use presented to the ED following a fall. Xray revealed R periprosthesis hip fx. Plan is for nonoperative Rx.   OT comments  Muaad is progressing towards his goals with plans to dc home today, he was pleasant and eager for therapy. Pt completed all functional transfers and ambulation  With rw at supervision level with minimal vc to maintain WB status. He completed grooming and upper body bathing while standing at the sink with supervision A, no LOB. Lower body dressing was completed with Min A for threading RLE. Pt was educated on fall prevention strategies and verbalized understanding of each. Pt recalled all precautions independently. Pt would benefit from continued OT services acutely should his length of stay continue. D/c plan remains appropriate.    Follow Up Recommendations  Home health OT    Equipment Recommendations  3 in 1 bedside commode       Precautions / Restrictions Precautions Precautions: Fall Restrictions Weight Bearing Restrictions: Yes RLE Weight Bearing: Touchdown weight bearing Other Position/Activity Restrictions: no abduction x 6 weeks       Mobility Bed Mobility Overal bed mobility: Needs Assistance             General bed mobility comments: pt in recliner upon arriva;    Transfers Overall transfer level: Needs assistance Equipment used: Rolling walker (2 wheeled) Transfers: Sit to/from [employer] Sit to Stand: Min guard         General transfer comment: increased time, vc for WB status    Balance Overall balance assessment: Needs assistance Sitting-balance support: No upper extremity supported;Feet supported Sitting balance-Leahy Scale: Good     Standing balance support: No upper  extremity supported;During functional activity;Bilateral upper extremity supported Standing balance-Leahy Scale: Fair Standing balance comment: pt does not rely on BUE supported on external device while standing at the sink and grooming                           ADL either performed or assessed with clinical judgement   ADL Overall ADL's : Needs assistance/impaired     Grooming: Oral care;Wash/dry hands;Wash/dry face;Applying deodorant;Brushing hair;Supervision/safety;Standing (at sink)   Upper Body Bathing: Supervision/ safety;Standing (at sink)       Upper Body Dressing : Set up;Standing   Lower Body Dressing: Minimal assistance;Sit to/from stand   Toilet Transfer: Supervision/safety;RW;Comfort height toilet           Functional mobility during ADLs: Supervision/safety;Cueing for safety;Rolling walker General ADL Comments: pt washed up at sink and dressed to prepare for dc home with supervision/min A     Cognition Arousal/Alertness: Awake/alert Behavior During Therapy: WFL for tasks assessed/performed Overall Cognitive Status: Within Functional Limits for tasks assessed        General Comments: Pleasant for therapy, receptive to all education        Exercises Exercises: General Lower Extremity General Exercises - Lower Extremity Ankle Circles/Pumps: AROM;Both;5 reps;Seated Quad Sets: AROM;Right;5 reps (seatead with LEs elevated) Gluteal Sets: AROM;Both;10 reps;Seated Long Arc Quad: AROM;Right;5 reps;Seated Heel Slides: AROM;Right;10 reps;Seated (with towel under foot to slide over hard floor) Hip Flexion/Marching: AAROM;Right;5 reps;Seated;Standing (completed with 50% assist for LE weigth in both sitting and standing)   Shoulder Instructions       General Comments VSS on RA however  with persistant cough throughout session, pt noted feeling hot at end of session, declined cool rag, ice and fan    Pertinent Vitals/ Pain       Pain Assessment:  Faces Pain Score: 5  Faces Pain Scale: Hurts little more Pain Location: R hip Pain Descriptors / Indicators: Tightness Pain Intervention(s): Limited activity within patient's tolerance;Monitored during session         Frequency  Min 1X/week        Progress Toward Goals  OT Goals(current goals can now be found in the care plan section)  Progress towards OT goals: Progressing toward goals  Acute Rehab OT Goals Patient Stated Goal: home this morning OT Goal Formulation: With patient Time For Goal Achievement: 11/28/20 Potential to Achieve Goals: Good ADL Goals Pt Will Perform Lower Body Dressing: with supervision;with adaptive equipment;sit to/from stand Pt Will Transfer to Toilet: with supervision;ambulating;bedside commode Pt Will Perform Toileting - Clothing Manipulation and hygiene: sit to/from stand;with supervision  Plan Discharge plan remains appropriate       AM-PAC OT "6 Clicks" Daily Activity     Outcome Measure   Help from another person eating meals?: None Help from another person taking care of personal grooming?: A Little Help from another person toileting, which includes using toliet, bedpan, or urinal?: A Little Help from another person bathing (including washing, rinsing, drying)?: A Little Help from another person to put on and taking off regular upper body clothing?: A Little Help from another person to put on and taking off regular lower body clothing?: A Little 6 Click Score: 19    End of Session Equipment Utilized During Treatment: Rolling walker  OT Visit Diagnosis: Muscle weakness (generalized) (M62.81);History of falling (Z91.81);Pain   Activity Tolerance Patient tolerated treatment well   Patient Left in chair;with call bell/phone within reach   Nurse Communication Mobility status;Weight bearing status        Time: 1041-1105 OT Time Calculation (min): 24 min  Charges: OT General Charges $OT Visit: 1 Visit OT Treatments $Self  Care/Home Management : 23-37 mins     Melysa Schroyer A Zamyiah Tino 11/16/2020, 11:26 AM

## 2020-11-16 NOTE — Discharge Summary (Signed)
Physician Discharge Summary  Tyler Tate:[id number] DOB: March 13, 1956 DOA: 11/12/2020  PCP: Geoffry Paradise, MD  Admit date: 11/12/2020 Discharge date: 11/16/2020  Admitted From:  home Disposition:  home  Recommendations for Outpatient Follow-up:  1. Follow up with PCP in 1-2 weeks 2. Follow-up with Dr. Linna Caprice from orthopedic surgery in 1 week-please call the office in the number provided 3. Please obtain BMP/CBC in one week 4. Please follow up on the following pending results:  Home Health:yes  Equipment/Devices: yes  Discharge Condition: Stable Code Status:   Code Status: Full Code Diet recommendation:  Diet Order            Diet general           Diet regular Room service appropriate? Yes; Fluid consistency: Thin  Diet effective now                 Brief/Interim Summary: 65 year old male with hypertension hyperlipidemia bilateral hip replacement and alcohol use presented with a fall last night and having right hip pain since then.  He tripped over an end table at his daughter's house without head injury or loss of consciousness. He was seen in the ED vitals stable mildly tachypneic x-ray showed acute nondisplaced intertrochanteric and subtrochanteric periprosthetic right hip fracture labs showed leukocytosis, COVID-19 done and admitted with orthopedic consultation.    Was evaluated by orthopedic surgery by Dr. Linna Caprice and Dr. Charlann Boxer- I discussed with Dr Linna Caprice personally and  they do not advise any surgical intervention at this time continue touchdown weightbearing with walker no abduction for 6 weeks, pain control. PT had significant overall doing well, continue with pain control outpatient/home health PT OT and follow-up with orthopedic as outpatient  Discharge Diagnoses:   Acute nondisplaced intertrochanteric and subtrochanteric periprosthetic right hip fracture: Seen by orthopedic who feels that it is stable and operative intervention are not indicated. continue  touchdown weightbearing with walker no abduction for 6 weeks, pain contro, DVT prophylaxis with aspirin 81 mg.  Patient will continue pain management, follow-up with orthopedics as outpatient.  I have communicated this recommendation from orthopedics and he has agreed to follow-up with orthopedics as outpatient.   PT worked with him again and educated at this time feels comfortable for home. Leukocytosis: Resolved. Mildly increased creatinine improved, continue to hydrate  Alcohol use disorder no signs of withdrawal.alcohol cessation.   GERD: Continue PPI Hyperlipidemia: on Lipitor Essential hypertension: Stable  continue his home meds including losartan,amlodipine, HCTZ, metoprolol.  Consults:  Orthopedic surgery  Subjective: aaox3, on the bedside chair.  He feels ready for home today.  He walked and mobilized better with physical therapy this morning.  Discharge Exam: Vitals:   11/15/20 2107 11/16/20 0426  BP: 136/61 134/85  Pulse: 91 79  Resp: 16   Temp: 98.9 F (37.2 C) 99.2 F (37.3 C)  SpO2: 97% 91%   General: Pt is alert, awake, not in acute distress Cardiovascular: RRR, S1/S2 +, no rubs, no gallops Respiratory: CTA bilaterally, no wheezing, no rhonchi Abdominal: Soft, NT, ND, bowel sounds + Extremities: no edema, no cyanosis  Discharge Instructions  Discharge Instructions    Diet general   Complete by: As directed    Discharge instructions   Complete by: As directed    - Nonweightbearing as tolerated with walker, no abduction for 6 weeks.  Follow-up with Dr. Linna Caprice orthopedic surgery next week.  Follow-up with PCP.  Please call call MD or return to ER for similar or worsening recurring problem that brought  you to hospital or if any fever,nausea/vomiting,abdominal pain, uncontrolled pain, chest pain,  shortness of breath or any other alarming symptoms.  Please follow-up your doctor as instructed in a week time and call the office for appointment.  Please avoid  alcohol, smoking, or any other illicit substance and maintain healthy habits including taking your regular medications as prescribed.  You were cared for by a hospitalist during your hospital stay. If you have any questions about your discharge medications or the care you received while you were in the hospital after you are discharged, you can call the unit and ask to speak with the hospitalist on call if the hospitalist that took care of you is not available.  Once you are discharged, your primary care physician will handle any further medical issues. Please note that NO REFILLS for any discharge medications will be authorized once you are discharged, as it is imperative that you return to your primary care physician (or establish a relationship with a primary care physician if you do not have one) for your aftercare needs so that they can reassess your need for medications and monitor your lab values   Increase activity slowly   Complete by: As directed      Allergies as of 11/16/2020      Reactions   Penicillin G Rash      Medication List    STOP taking these medications   ibuprofen 200 MG tablet Commonly known as: ADVIL     TAKE these medications   amLODipine 5 MG tablet Commonly known as: NORVASC Take 5 mg by mouth daily.   amoxicillin 500 MG capsule Commonly known as: AMOXIL Take 2,000 mg by mouth as directed. For dental appt.   aspirin 81 MG EC tablet Take 1 tablet (81 mg total) by mouth 2 (two) times daily for 28 days. Swallow whole.   atorvastatin 40 MG tablet Commonly known as: LIPITOR Take 40 mg by mouth daily.   glucosamine-chondroitin 500-400 MG tablet Take 1 tablet by mouth daily.   HYDROcodone-acetaminophen 5-325 MG tablet Commonly known as: NORCO/VICODIN Take 1 tablet by mouth every 6 (six) hours as needed for up to 5 days for moderate pain.   losartan-hydrochlorothiazide 100-25 MG tablet Commonly known as: HYZAAR Take 1 tablet by mouth daily.    methocarbamol 500 MG tablet Commonly known as: ROBAXIN Take 1 tablet (500 mg total) by mouth every 6 (six) hours as needed for up to 30 doses for muscle spasms.   metoprolol succinate 50 MG 24 hr tablet Commonly known as: TOPROL-XL Take 50 mg by mouth daily.   MULTIVITAMIN ADULT PO Take 1 tablet by mouth daily.   omeprazole 20 MG capsule Commonly known as: PRILOSEC Take 1 capsule (20 mg total) by mouth daily. What changed: when to take this   venlafaxine XR 150 MG 24 hr capsule Commonly known as: EFFEXOR-XR Take 150 mg by mouth daily with breakfast.            Durable Medical Equipment  (From admission, onward)         Start     Ordered   11/14/20 1403  For home use only DME 3 n 1  Once        11/14/20 1402   11/14/20 1402  For home use only DME Walker rolling  Once       Question Answer Comment  Walker: With 5 Inch Wheels   Patient needs a walker to treat with the following condition Hip fracture (  HCC)      11/14/20 1402          Follow-up Information    Swinteck, Arlys John, MD Follow up in 1 week(s).   Specialty: Orthopedic Surgery Contact information: [address] [place] [address] [phone]        Geoffry Paradise, MD Follow up in 1 week(s).   Specialty: Internal Medicine Contact information: [address] [place] [address] [phone]              Allergies  Allergen Reactions  . Penicillin G Rash    The results of significant diagnostics from this hospitalization (including imaging, microbiology, ancillary and laboratory) are listed below for reference.    Microbiology: Recent Results (from the past 240 hour(s))  Resp Panel by RT-PCR (Flu A&B, Covid) Nasopharyngeal Swab     Status: None   Collection Time: 11/12/20  9:03 AM   Specimen: Nasopharyngeal Swab; Nasopharyngeal(NP) swabs in vial transport medium  Result Value Ref Range Status   SARS Coronavirus 2 by RT PCR NEGATIVE NEGATIVE Final    Comment:  (NOTE) SARS-CoV-2 target nucleic acids are NOT DETECTED.  The SARS-CoV-2 RNA is generally detectable in upper respiratory specimens during the acute phase of infection. The lowest concentration of SARS-CoV-2 viral copies this assay can detect is 138 copies/mL. A negative result does not preclude SARS-Cov-2 infection and should not be used as the sole basis for treatment or other patient management decisions. A negative result may occur with  improper specimen collection/handling, submission of specimen other than nasopharyngeal swab, presence of viral mutation(s) within the areas targeted by this assay, and inadequate number of viral copies(<138 copies/mL). A negative result must be combined with clinical observations, patient history, and epidemiological information. The expected result is Negative.  Fact Sheet for Patients:  BloggerCourse.com  Fact Sheet for Healthcare Providers:  SeriousBroker.it  This test is no t yet approved or cleared by the [place] FDA and  has been authorized for detection and/or diagnosis of SARS-CoV-2 by FDA under an Emergency Use Authorization (EUA). This EUA will remain  in effect (meaning this test can be used) for the duration of the COVID-19 declaration under Section 564(b)(1) of the Act, 21 U.S.C.section 360bbb-3(b)(1), unless the authorization is terminated  or revoked sooner.       Influenza A by PCR NEGATIVE NEGATIVE Final   Influenza B by PCR NEGATIVE NEGATIVE Final    Comment: (NOTE) The Xpert Xpress SARS-CoV-2/FLU/RSV plus assay is intended as an aid in the diagnosis of influenza from Nasopharyngeal swab specimens and should not be used as a sole basis for treatment. Nasal washings and aspirates are unacceptable for Xpert Xpress SARS-CoV-2/FLU/RSV testing.  Fact Sheet for Patients: BloggerCourse.com  Fact Sheet for Healthcare  Providers: SeriousBroker.it  This test is not yet approved or cleared by the [place] FDA and has been authorized for detection and/or diagnosis of SARS-CoV-2 by FDA under an Emergency Use Authorization (EUA). This EUA will remain in effect (meaning this test can be used) for the duration of the COVID-19 declaration under Section 564(b)(1) of the Act, 21 U.S.C. section 360bbb-3(b)(1), unless the authorization is terminated or revoked.  Performed at [facility], [address], [place], [place] [address]   Surgical pcr screen     Status: None   Collection Time: 11/12/20  1:48 PM   Specimen: Nasal Mucosa; Nasal Swab  Result Value Ref Range Status   MRSA, PCR NEGATIVE NEGATIVE Final   Staphylococcus aureus NEGATIVE  NEGATIVE Final    Comment: (NOTE) The Xpert SA Assay (FDA approved for NASAL specimens in patients 19 years of age and older), is one component of a comprehensive surveillance program. It is not intended to diagnose infection nor to guide or monitor treatment. Performed at [facility], [address] [place], [place] [address]     Procedures/Studies: [place] Pelvis 1-2 Views  Result Date: 11/12/2020 CLINICAL DATA:  Fall, RIGHT hip pain EXAM: PELVIS - 1-2 VIEW COMPARISON:  None. FINDINGS: Bilateral hip prosthesis.  No dislocation. There is a vertical fracture through the RIGHT lesser trochanter paralleling the stem of the prosthetic. 5 mm gap between the lesser trochanter and the prosthetic. No pelvic fracture. IMPRESSION: Fracture along the plane of the RIGHT lesser trochanter. No evidence of prosthetic dislocation. Electronically Signed   By: Genevive Bi M.D.   On: 11/12/2020 09:13   CT HIP RIGHT WO CONTRAST  Result Date: 11/12/2020 CLINICAL DATA:  Right hip fracture after fall. EXAM: CT OF THE RIGHT HIP WITHOUT CONTRAST TECHNIQUE: Multidetector CT imaging of the right hip was performed according to the standard protocol.  Multiplanar CT image reconstructions were also generated. COMPARISON:  Pelvis and right femur x-rays from same day. FINDINGS: Bones/Joint/Cartilage Prior right total hip arthroplasty. Acute periprosthetic fracture again noted with longitudinal component through the right lesser trochanter paralleling the femoral stem, extending superiorly into the posterior greater tuberosity (series 9, image 33). There is up to 4 mm distraction of the fracture fragments at the base of the lesser trochanter. There is an oblique nondisplaced transverse component through the posterior and lateral subtrochanteric femur (series 7, image 75; series 9, image 35). No dislocation. No joint effusion. Right greater trochanteric lipohemobursitis. Ligaments Ligaments are suboptimally evaluated by CT. Muscles and Tendons Grossly intact. Soft tissue No fluid collection or hematoma.  No soft tissue mass. IMPRESSION: 1. Prior right total hip arthroplasty with acute periprosthetic inter- and subtrochanteric fracture as described above. 2. Right greater trochanteric lipohemobursitis. Electronically Signed   By: Obie Dredge M.D.   On: 11/12/2020 13:43   Chest Portable 1 View  Result Date: 11/12/2020 CLINICAL DATA:  Preop RIGHT hip fracture EXAM: PORTABLE CHEST 1 VIEW COMPARISON:  None. FINDINGS: Normal mediastinum and cardiac silhouette. Normal pulmonary vasculature. No evidence of effusion, infiltrate, or pneumothorax. No acute bony abnormality. IMPRESSION: Low lung volumes.  No acute cardiopulmonary process. Electronically Signed   By: Genevive Bi M.D.   On: 11/12/2020 12:25   DG Femur Min 2 Views Right  Result Date: 11/12/2020 CLINICAL DATA:  Right hip pain after fall yesterday. EXAM: RIGHT FEMUR 2 VIEWS COMPARISON:  Right hip x-rays dated August 21, 2006. FINDINGS: Prior right total hip arthroplasty with acute nondisplaced inter- and subtrochanteric periprosthetic fracture. No dislocation. Minimal degenerative changes of the  right knee. Bone mineralization is normal. Soft tissues are unremarkable. Atherosclerotic vascular calcifications. IMPRESSION: 1. Acute nondisplaced intertrochanteric and subtrochanteric periprosthetic right femur fracture. Electronically Signed   By: Obie Dredge M.D.   On: 11/12/2020 09:21    Labs: BNP (last 3 results) No results for input(s): BNP in the last 8760 hours. Basic Metabolic Panel: Recent Labs  Lab 11/12/20 0903 11/13/20 0248 11/14/20 0416 11/15/20 0228 11/16/20 0307  NA 138 135 136 135 134*  K 4.2 3.7 3.8 4.7 4.0  CL 106 101 100 97* 97*  CO2 22 26 27 26 27   GLUCOSE 95 109* 111* 119* 119*  BUN 26* 16 14 23  26*  CREATININE 1.18 0.97 0.93  1.29* 1.21  CALCIUM 8.8* 8.9 9.2 9.2 9.0  MG  --  1.8  --   --   --   PHOS  --  3.4  --   --   --    Liver Function Tests: No results for input(s): AST, ALT, ALKPHOS, BILITOT, PROT, ALBUMIN in the last 168 hours. No results for input(s): LIPASE, AMYLASE in the last 168 hours. No results for input(s): AMMONIA in the last 168 hours. CBC: Recent Labs  Lab 11/12/20 0903 11/13/20 0248 11/14/20 0416 11/15/20 0228 11/16/20 0307  WBC 13.8* 7.9 7.5 8.1 6.5  NEUTROABS 9.5*  --   --   --   --   HGB 12.9* 13.0 13.1 12.9* 13.2  HCT 38.4* 38.5* 38.6* 38.0* 38.7*  MCV 94.3 93.9 93.0 93.6 92.4  PLT 289 212 215 217 229   Cardiac Enzymes: No results for input(s): CKTOTAL, CKMB, CKMBINDEX, TROPONINI in the last 168 hours. BNP: Invalid input(s): POCBNP CBG: No results for input(s): GLUCAP in the last 168 hours. D-Dimer No results for input(s): DDIMER in the last 72 hours. Hgb A1c No results for input(s): HGBA1C in the last 72 hours. Lipid Profile No results for input(s): CHOL, HDL, LDLCALC, TRIG, CHOLHDL, LDLDIRECT in the last 72 hours. Thyroid function studies No results for input(s): TSH, T4TOTAL, T3FREE, THYROIDAB in the last 72 hours.  Invalid input(s): FREET3 Anemia work up No results for input(s): VITAMINB12, FOLATE,  FERRITIN, TIBC, IRON, RETICCTPCT in the last 72 hours. Urinalysis    Component Value Date/Time   COLORURINE YELLOW 05/28/2009 1606   APPEARANCEUR CLEAR 05/28/2009 1606   LABSPEC 1.026 05/28/2009 1606   PHURINE 7.0 05/28/2009 1606   GLUCOSEU NEGATIVE 05/28/2009 1606   HGBUR NEGATIVE 05/28/2009 1606   BILIRUBINUR NEGATIVE 05/28/2009 1606   KETONESUR NEGATIVE 05/28/2009 1606   PROTEINUR 100 (A) 05/28/2009 1606   UROBILINOGEN 0.2 05/28/2009 1606   NITRITE NEGATIVE 05/28/2009 1606   LEUKOCYTESUR NEGATIVE 05/28/2009 1606   Sepsis Labs Invalid input(s): PROCALCITONIN,  WBC,  LACTICIDVEN Microbiology Recent Results (from the past 240 hour(s))  Resp Panel by RT-PCR (Flu A&B, Covid) Nasopharyngeal Swab     Status: None   Collection Time: 11/12/20  9:03 AM   Specimen: Nasopharyngeal Swab; Nasopharyngeal(NP) swabs in vial transport medium  Result Value Ref Range Status   SARS Coronavirus 2 by RT PCR NEGATIVE NEGATIVE Final    Comment: (NOTE) SARS-CoV-2 target nucleic acids are NOT DETECTED.  The SARS-CoV-2 RNA is generally detectable in upper respiratory specimens during the acute phase of infection. The lowest concentration of SARS-CoV-2 viral copies this assay can detect is 138 copies/mL. A negative result does not preclude SARS-Cov-2 infection and should not be used as the sole basis for treatment or other patient management decisions. A negative result may occur with  improper specimen collection/handling, submission of specimen other than nasopharyngeal swab, presence of viral mutation(s) within the areas targeted by this assay, and inadequate number of viral copies(<138 copies/mL). A negative result must be combined with clinical observations, patient history, and epidemiological information. The expected result is Negative.  Fact Sheet for Patients:  BloggerCourse.com  Fact Sheet for Healthcare Providers:   SeriousBroker.it  This test is no t yet approved or cleared by the [place] FDA and  has been authorized for detection and/or diagnosis of SARS-CoV-2 by FDA under an Emergency Use Authorization (EUA). This EUA will remain  in effect (meaning this test can be used) for the duration of the COVID-19 declaration under Section  564(b)(1) of the Act, 21 U.S.C.section 360bbb-3(b)(1), unless the authorization is terminated  or revoked sooner.       Influenza A by PCR NEGATIVE NEGATIVE Final   Influenza B by PCR NEGATIVE NEGATIVE Final    Comment: (NOTE) The Xpert Xpress SARS-CoV-2/FLU/RSV plus assay is intended as an aid in the diagnosis of influenza from Nasopharyngeal swab specimens and should not be used as a sole basis for treatment. Nasal washings and aspirates are unacceptable for Xpert Xpress SARS-CoV-2/FLU/RSV testing.  Fact Sheet for Patients: BloggerCourse.com  Fact Sheet for Healthcare Providers: SeriousBroker.it  This test is not yet approved or cleared by the [place] FDA and has been authorized for detection and/or diagnosis of SARS-CoV-2 by FDA under an Emergency Use Authorization (EUA). This EUA will remain in effect (meaning this test can be used) for the duration of the COVID-19 declaration under Section 564(b)(1) of the Act, 21 U.S.C. section 360bbb-3(b)(1), unless the authorization is terminated or revoked.  Performed at [facility], [address] [place], [place] [address]   Surgical pcr screen     Status: None   Collection Time: 11/12/20  1:48 PM   Specimen: Nasal Mucosa; Nasal Swab  Result Value Ref Range Status   MRSA, PCR NEGATIVE NEGATIVE Final   Staphylococcus aureus NEGATIVE NEGATIVE Final    Comment: (NOTE) The Xpert SA Assay (FDA approved for NASAL specimens in patients 72 years of age and older), is one component of a comprehensive surveillance program.  It is not intended to diagnose infection nor to guide or monitor treatment. Performed at [facility], [address], [place], [place] [address]      Time coordinating discharge: 35 minutes  SIGNED: Lanae Boast, MD  [facility] 11/16/2020, 10:01 AM  If 7PM-7AM, please contact night-coverage [email or web address]

## 2020-11-16 NOTE — TOC Transition Note (Signed)
Transition of Care [facility]) - CM/SW Discharge Note   Patient Details  Name: Tyler Tate MRN: [id number] Date of Birth: 17-Apr-1956  Transition of Care [facility]) CM/SW Contact:  Epifanio Lesches, RN Phone Number: 11/16/2020, 11:40 AM   Clinical Narrative:    Patient will DC XB:[facility]  Anticipated DC date: 11/16/2020 Family notified: yes, wife Transport by: car  Admitted s/p a fall, suffered a R periprosthesis hip fx. Hx of HTN, hyperlipidemia, h/o bilateral hip replacement, and alcohol. From home with wife.  Plan : non operative treatment for  R periprosthesis hip fx  Per MD patient ready for DC today. RN, patient, patient's family, and facility notified of DC. Home health and DME orders noted. Pt agreeable to home health services. States wife will provide supervision/ assistance needed once d/c. Choice offered. Pt without services preference. Referral made with [facility] and accepted, SOC within 48 hrs post d/c. DME: 3in1/BSC will be delivered to bedside prior to d/c. Pt states already has a rolling walker @ home.  Pt without Rx med concerns. Post hospital follow up noted on AVS.  RNCM will sign off for now as intervention is no longer needed. Please consult [place] again if new needs arise.   Final next level of care: Home w Home Health Services Barriers to Discharge: No Barriers Identified   Patient Goals and CMS Choice     Choice offered to / list presented to : Patient  Discharge Placement                       Discharge Plan and Services                DME Arranged: 3-N-1 (Already has @ home rolling walker) DME Agency: [employer] Date DME Agency Contacted: 11/16/20 Time DME Agency Contacted: 1138   HH Arranged: PT,OT HH Agency: Other - See comment ([facility]) Date HH Agency Contacted: 11/16/20 Time HH Agency Contacted: 1139 Representative spoke with at [facility] Agency: Misty Stanley  Social Determinants of Health (SDOH) Interventions      Readmission Risk Interventions No flowsheet data found.

## 2022-04-26 ENCOUNTER — Other Ambulatory Visit: Payer: Self-pay | Admitting: Nurse Practitioner

## 2022-04-26 DIAGNOSIS — D72829 Elevated white blood cell count, unspecified: Secondary | ICD-10-CM

## 2022-04-26 NOTE — Progress Notes (Unsigned)
New Hematology/Oncology Consult   Requesting MD: Tyler Tate  [phone]    Reason for Consult: Leukocytosis  HPI: Tyler Tate is a 66 year old man referred for evaluation of an elevated white count.  He saw Tyler Tate for an annual physical exam on 04/18/2022.  CBC done on 04/14/2022 returned with a total white count of 19.4 with differential showing increased lymphocytes, neutrophils and monocytes; hemoglobin 15.4, platelet count 430,000; chemistry panel unremarkable except BUN elevated at 31.  Repeat CBC on 04/18/2022-total white count 18.9, differential with elevated neutrophils, lymphocytes and monocytes, hemoglobin and platelet count in normal range.  Review of CBCs in the EMR-most recent May 2022 at which time he was hospitalized following a fall with an acute nondisplaced intertrochanteric and subtrochanteric periprosthetic right hip fracture-White count on admission 13.8 on 11/12/2020, white count normal at discharge on 11/16/2020 (6.5).  Tyler Tate reports feeling well.  No recent illnesses/infections.  No fevers or sweats.  He has a good appetite.  No weight loss.  He had both shoulders injected with steroids on 04/06/2022.     Past Medical History:  Diagnosis Date   Hypertension      Past Surgical History:  Procedure Laterality Date   JOINT REPLACEMENT     hip right and left     Current Outpatient Medications:    amLODipine (NORVASC) 5 MG tablet, Take 5 mg by mouth daily., Disp: , Rfl: 1   amoxicillin (AMOXIL) 500 MG capsule, Take 2,000 mg by mouth as directed. For dental appt., Disp: , Rfl:    atorvastatin (LIPITOR) 40 MG tablet, Take 40 mg by mouth daily., Disp: , Rfl:    glucosamine-chondroitin 500-400 MG tablet, Take 3 tablets by mouth daily., Disp: , Rfl:    losartan-hydrochlorothiazide (HYZAAR) 100-25 MG tablet, Take 1 tablet by mouth daily., Disp: , Rfl:    metoprolol succinate (TOPROL-XL) 50 MG 24 hr tablet, Take 50 mg by mouth daily., Disp: , Rfl:     Multiple Vitamins-Minerals (MULTIVITAMIN ADULT PO), Take 1 tablet by mouth daily., Disp: , Rfl:    omeprazole (PRILOSEC) 20 MG capsule, Take 1 capsule (20 mg total) by mouth daily., Disp: 30 capsule, Rfl: 0   venlafaxine XR (EFFEXOR-XR) 150 MG 24 hr capsule, Take 150 mg by mouth daily with breakfast., Disp: , Rfl: 2   methocarbamol (ROBAXIN) 500 MG tablet, Take 1 tablet (500 mg total) by mouth every 6 (six) hours as needed for up to 30 doses for muscle spasms. (Patient not taking: Reported on 04/27/2022), Disp: 30 tablet, Rfl: 0:     Allergies  Allergen Reactions   Penicillin G Rash    FH: Mother deceased age 41 ovarian cancer; sister deceased age 10 with a rare form of appendiceal cancer.  SOCIAL HISTORY: He lives in [place].  He is married.  He sells insurance.  [place]  He has never been a cigarette smoker.  He quit drinking alcohol about 1 year ago.  He reports replacing ice cream for alcohol.  Review of Systems: He has seasonal allergies.  No fevers or sweats.  He has a good appetite.  No weight loss.  No pain except related to shoulder issues.  No bleeding.  No recurrent/recent infections.  No shortness of breath or cough.  No rash.  No swollen, red, painful joints.  No dysphagia.  He denies chest pain.  No leg swelling or calf pain.  No change in bowel habits.  No bloody or black stools.  No hematuria or dysuria.  Physical Exam:  Blood pressure 118/78, pulse 90, temperature 98.1 F (36.7 C), temperature source Oral, resp. rate 18, height 6\' 1"  (1.854 m), weight 222 lb (100.7 kg), SpO2 97 %.  HEENT: No thrush or ulcers. Lungs: Lungs clear bilaterally. Cardiac: Regular rate and rhythm. Abdomen: No hepatosplenomegaly. Vascular: No leg edema. Lymph nodes: No palpable cervical, supraclavicular, axillary or inguinal lymph nodes. Neurologic: Alert and oriented. Skin: No rash.  LABS:   Recent Labs    04/27/22 1052  WBC 9.7  HGB 13.9  HCT 41.4   PLT 316  Peripheral blood smear-majority of white blood cells are mature appearing neutrophils, slight increase in mature appearing monocytes, few 5 lobed polys, no blasts or other young forms, no monotonous white cell population; Red cell morphology difficult to evaluate due to thick smear, few ovalocytes, no nucleated red blood cells, polychromasia not increased, some areas have a large number of burr cells, likely artifactual; platelets normal in number, few small platelet clumps.  No results for input(s): "NA", "K", "CL", "CO2", "GLUCOSE", "BUN", "CREATININE", "CALCIUM" in the last 72 hours.    RADIOLOGY:  No results found.  Assessment and Plan:   Leukocytosis Hypertension Hypercholesterolemia Arthritis History of alcohol use  Tyler Tate was referred for evaluation of leukocytosis.  The total white count is normal today.  The elevated white count was likely due to bilateral shoulder steroid injections on 04/06/2022.  Recommend evaluation for chronic liver disease given the peripheral blood smear findings and his history of alcohol use.  We did not schedule additional follow-up at the hematology clinic.  We are available to see him in the future if needed.  Patient seen with Dr. 06/06/2022.      Tyler Perna, NP 04/27/2022, 12:00 PM   This was a shared visit with 13/06/2021.  Tyler Tate was interviewed and examined.  I reviewed the peripheral blood smear.  He is referred for evaluation of leukocytosis.  The leukocytosis was noted after he received steroid injections to the shoulders.  The white count is normal today.  I suspect the leukocytosis is related to steroid therapy. I have a low clinical suspicion for a primary hematologic diagnosis such as a myeloproliferative disorder or metabolic malignancy.  We will be glad to see him again if he develops recurrent leukocytosis or a new hematologic abnormality.  I was present for greater than 50% of today's visit.  I performed  medical decision making.  Tyler Ralph, MD

## 2022-04-27 ENCOUNTER — Inpatient Hospital Stay: Payer: Medicare Other | Attending: Nurse Practitioner

## 2022-04-27 ENCOUNTER — Other Ambulatory Visit: Payer: Self-pay

## 2022-04-27 ENCOUNTER — Encounter: Payer: Self-pay | Admitting: Nurse Practitioner

## 2022-04-27 ENCOUNTER — Inpatient Hospital Stay (HOSPITAL_BASED_OUTPATIENT_CLINIC_OR_DEPARTMENT_OTHER): Payer: Medicare Other | Admitting: Nurse Practitioner

## 2022-04-27 VITALS — BP 118/78 | HR 90 | Temp 98.1°F | Resp 18 | Ht 73.0 in | Wt 222.0 lb

## 2022-04-27 DIAGNOSIS — D72829 Elevated white blood cell count, unspecified: Secondary | ICD-10-CM | POA: Insufficient documentation

## 2022-04-27 LAB — VITAMIN B12: Vitamin B-12: 184 pg/mL (ref 180–914)

## 2022-04-27 LAB — CBC WITH DIFFERENTIAL (CANCER CENTER ONLY)
Abs Immature Granulocytes: 0.05 10*3/uL (ref 0.00–0.07)
Basophils Absolute: 0.1 10*3/uL (ref 0.0–0.1)
Basophils Relative: 1 %
Eosinophils Absolute: 0.1 10*3/uL (ref 0.0–0.5)
Eosinophils Relative: 1 %
HCT: 41.4 % (ref 39.0–52.0)
Hemoglobin: 13.9 g/dL (ref 13.0–17.0)
Immature Granulocytes: 1 %
Lymphocytes Relative: 19 %
Lymphs Abs: 1.8 10*3/uL (ref 0.7–4.0)
MCH: 30.2 pg (ref 26.0–34.0)
MCHC: 33.6 g/dL (ref 30.0–36.0)
MCV: 90 fL (ref 80.0–100.0)
Monocytes Absolute: 1.2 10*3/uL — ABNORMAL HIGH (ref 0.1–1.0)
Monocytes Relative: 12 %
Neutro Abs: 6.5 10*3/uL (ref 1.7–7.7)
Neutrophils Relative %: 66 %
Platelet Count: 316 10*3/uL (ref 150–400)
RBC: 4.6 MIL/uL (ref 4.22–5.81)
RDW: 12.9 % (ref 11.5–15.5)
WBC Count: 9.7 10*3/uL (ref 4.0–10.5)
nRBC: 0 % (ref 0.0–0.2)

## 2022-04-27 LAB — SAVE SMEAR(SSMR), FOR PROVIDER SLIDE REVIEW

## 2022-04-28 ENCOUNTER — Other Ambulatory Visit: Payer: Self-pay

## 2022-04-28 ENCOUNTER — Telehealth: Payer: Self-pay

## 2022-04-28 DIAGNOSIS — D72829 Elevated white blood cell count, unspecified: Secondary | ICD-10-CM

## 2022-04-28 NOTE — Telephone Encounter (Signed)
-----   Message from Owens Shark, NP sent at 04/28/2022  3:43 PM EDT ----- Please let him know b12 returned in low normal range. Suggest repeat b12 level  in 2-3 months and then begin B12 replacement if still low. Please schedule lab and f/u with Dr Benay Spice in 8 to 12 weeks.

## 2022-04-28 NOTE — Telephone Encounter (Signed)
Patient is schedule for lab in January to repeat his vitamin 12. I sent a message to scheduler to have him schedule for Dr. Benay Spice in 8 to 12 weeks.

## 2022-07-01 ENCOUNTER — Inpatient Hospital Stay: Payer: Medicare Other | Attending: Nurse Practitioner

## 2022-07-01 DIAGNOSIS — E538 Deficiency of other specified B group vitamins: Secondary | ICD-10-CM | POA: Diagnosis present

## 2022-07-01 DIAGNOSIS — D72829 Elevated white blood cell count, unspecified: Secondary | ICD-10-CM

## 2022-07-01 LAB — VITAMIN B12: Vitamin B-12: 207 pg/mL (ref 180–914)

## 2022-07-04 ENCOUNTER — Telehealth: Payer: Self-pay

## 2022-07-04 ENCOUNTER — Other Ambulatory Visit: Payer: Self-pay

## 2022-07-04 DIAGNOSIS — D72829 Elevated white blood cell count, unspecified: Secondary | ICD-10-CM

## 2022-07-04 NOTE — Telephone Encounter (Signed)
-----   Message from Owens Shark, NP sent at 07/04/2022  3:37 PM EST ----- Please forward a copy of the lab from 07/01/2022 to Dr. Reynaldo Minium; Dr. Gearldine Shown recommendation is to repeat B12 level in 6-12 months.

## 2022-07-22 ENCOUNTER — Other Ambulatory Visit: Payer: Medicare Other

## 2022-07-22 ENCOUNTER — Ambulatory Visit: Payer: Medicare Other | Admitting: Oncology

## 2022-08-19 ENCOUNTER — Other Ambulatory Visit: Payer: Self-pay | Admitting: Orthopedic Surgery

## 2022-08-19 DIAGNOSIS — M25511 Pain in right shoulder: Secondary | ICD-10-CM

## 2022-09-03 ENCOUNTER — Ambulatory Visit
Admission: RE | Admit: 2022-09-03 | Discharge: 2022-09-03 | Disposition: A | Discharge status: Home / Self Care | Payer: Medicare Other | Source: Ambulatory Visit | Attending: Orthopedic Surgery | Admitting: Orthopedic Surgery

## 2022-09-03 DIAGNOSIS — M25511 Pain in right shoulder: Secondary | ICD-10-CM

## 2022-09-15 ENCOUNTER — Other Ambulatory Visit: Payer: Self-pay

## 2022-09-15 ENCOUNTER — Emergency Department (HOSPITAL_BASED_OUTPATIENT_CLINIC_OR_DEPARTMENT_OTHER)
Admission: EM | Admit: 2022-09-15 | Discharge: 2022-09-15 | Disposition: A | Discharge status: Home / Self Care | Payer: Medicare Other | Attending: Emergency Medicine | Admitting: Emergency Medicine

## 2022-09-15 ENCOUNTER — Encounter (HOSPITAL_BASED_OUTPATIENT_CLINIC_OR_DEPARTMENT_OTHER): Payer: Self-pay

## 2022-09-15 ENCOUNTER — Emergency Department (HOSPITAL_BASED_OUTPATIENT_CLINIC_OR_DEPARTMENT_OTHER): Payer: Medicare Other | Admitting: Radiology

## 2022-09-15 DIAGNOSIS — Z20822 Contact with and (suspected) exposure to covid-19: Secondary | ICD-10-CM | POA: Insufficient documentation

## 2022-09-15 DIAGNOSIS — R051 Acute cough: Secondary | ICD-10-CM

## 2022-09-15 DIAGNOSIS — J069 Acute upper respiratory infection, unspecified: Secondary | ICD-10-CM | POA: Insufficient documentation

## 2022-09-15 DIAGNOSIS — R059 Cough, unspecified: Secondary | ICD-10-CM | POA: Diagnosis present

## 2022-09-15 DIAGNOSIS — Z79899 Other long term (current) drug therapy: Secondary | ICD-10-CM | POA: Insufficient documentation

## 2022-09-15 DIAGNOSIS — J40 Bronchitis, not specified as acute or chronic: Secondary | ICD-10-CM | POA: Insufficient documentation

## 2022-09-15 DIAGNOSIS — I10 Essential (primary) hypertension: Secondary | ICD-10-CM | POA: Insufficient documentation

## 2022-09-15 DIAGNOSIS — B9789 Other viral agents as the cause of diseases classified elsewhere: Secondary | ICD-10-CM

## 2022-09-15 LAB — RESP PANEL BY RT-PCR (RSV, FLU A&B, COVID)  RVPGX2
Influenza A by PCR: NEGATIVE
Influenza B by PCR: NEGATIVE
Resp Syncytial Virus by PCR: NEGATIVE
SARS Coronavirus 2 by RT PCR: NEGATIVE

## 2022-09-15 LAB — GROUP A STREP BY PCR: Group A Strep by PCR: NOT DETECTED

## 2022-09-15 MED ORDER — BENZONATATE 100 MG PO CAPS: 100.0000 mg | ORAL_CAPSULE | Freq: Three times a day (TID) | ORAL | 0 refills | Status: DC

## 2022-09-15 MED ORDER — ALBUTEROL SULFATE HFA 108 (90 BASE) MCG/ACT IN AERS
2.0000 | INHALATION_SPRAY | RESPIRATORY_TRACT | Status: DC | PRN
  Administered 2022-09-15: 2 via RESPIRATORY_TRACT
  Filled 2022-09-15: qty 6.7

## 2022-09-15 MED ORDER — HYDROCOD POLI-CHLORPHE POLI ER 10-8 MG/5ML PO SUER: 5.0000 mL | Freq: Two times a day (BID) | ORAL | 0 refills | Status: DC

## 2022-09-15 NOTE — ED Triage Notes (Signed)
Patient here POV from [place] for proximately 1 Week. Symptoms have progressed into a Strong Dry Cough. Noted a Sore Throat today.  No Fever.   NAD noted during Triage. A&Ox4. GCS 15. Ambulatory.

## 2022-09-15 NOTE — Discharge Instructions (Addendum)
Please read and follow all provided instructions.  Your diagnoses today include:  1. Bronchitis   2. Acute cough   3. Viral respiratory infection     Tests performed today include: Chest x-ray - does not show any pneumonia COVID, flu, RSV and strep testing were negative Vital signs. See below for your results today.   Medications prescribed:  Albuterol inhaler - medication that opens up your airway  Use inhaler as follows: 1-2 puffs with spacer every 4 hours as needed for wheezing, cough, or shortness of breath.   Tessalon Perles - cough suppressant medication  Tussinex - narcotic cough suppressant syrup  You have been prescribed narcotic cough suppressant such as Tussinex: DO NOT drive or perform any activities that require you to be awake and alert because this medicine can make you drowsy.   Take any prescribed medications only as directed.  Home care instructions:  Follow any educational materials contained in this packet.  Follow-up instructions: Please follow-up with your primary care provider in the next 3 days for further evaluation of your symptoms and a recheck if you are not feeling better.   Return instructions:  Please return to the Emergency Department if you experience worsening symptoms. Please return with worsening wheezing, shortness of breath, or difficulty breathing. Return with persistent fever above 101F.  Please return if you have any other emergent concerns.  Additional Information:  Your vital signs today were: BP (!) 143/80 (BP Location: Right Arm)   Pulse 100   Temp 99.1 F (37.3 C) (Oral)   Resp 18   Ht 6\' 1"  (1.854 m)   Wt 100.7 kg   SpO2 96%   BMI 29.29 kg/m  If your blood pressure (BP) was elevated above 135/85 this visit, please have this repeated by your doctor within one month. --------------

## 2022-09-15 NOTE — ED Provider Notes (Signed)
[facility] Provider Note   CSN: [id number] Arrival date & time: 09/15/22  2016     History  Chief Complaint  Patient presents with   Cough    Tyler Tate is a 67 y.o. male.  Patient presents to the emergency department for evaluation of 3 to 4 days of cough.  Patient has had some subjective fevers and chills at home but is afebrile on emergency department arrival tonight.  He has a history of hypertension and high cholesterol.  He reports productive cough up until today when it is become more dry.  Initially thought he was having exacerbation of allergy symptoms. Cough was productive of yellow sputum.  Today he developed facial pressure and sinus pressure with radiation of pressure into the teeth.  No ear pain.  No vomiting or diarrhea.  No wheezing.  No history of COPD or asthma.  No known sick contacts.  He states that he has not been able to sleep well over the past 2 nights due to violent coughing.  He has tried Flonase, Mucinex without improvement.       Home Medications Prior to Admission medications   Medication Sig Start Date End Date Taking? Authorizing Provider  benzonatate (TESSALON) 100 MG capsule Take 1 capsule (100 mg total) by mouth every 8 (eight) hours. 09/15/22  Yes Carlisle Cater, PA-C  chlorpheniramine-HYDROcodone (TUSSIONEX) 10-8 MG/5ML Take 5 mLs by mouth 2 (two) times daily. 09/15/22  Yes Carlisle Cater, PA-C  amLODipine (NORVASC) 5 MG tablet Take 5 mg by mouth daily. 09/11/15   [provider]  amoxicillin (AMOXIL) 500 MG capsule Take 2,000 mg by mouth as directed. For dental appt. 05/13/19   [provider]  atorvastatin (LIPITOR) 40 MG tablet Take 40 mg by mouth daily. 05/01/15   [provider]  glucosamine-chondroitin 500-400 MG tablet Take 3 tablets by mouth daily.    [provider]  losartan-hydrochlorothiazide (HYZAAR) 100-25 MG tablet Take 1 tablet by mouth daily. 05/10/15    [provider]  methocarbamol (ROBAXIN) 500 MG tablet Take 1 tablet (500 mg total) by mouth every 6 (six) hours as needed for up to 30 doses for muscle spasms. Patient not taking: Reported on 04/27/2022 11/16/20   Antonieta Pert, MD  metoprolol succinate (TOPROL-XL) 50 MG 24 hr tablet Take 50 mg by mouth daily. 05/01/15   [provider]  Multiple Vitamins-Minerals (MULTIVITAMIN ADULT PO) Take 1 tablet by mouth daily.    [provider]  omeprazole (PRILOSEC) 20 MG capsule Take 1 capsule (20 mg total) by mouth daily. 11/16/20 04/27/22  Antonieta Pert, MD  venlafaxine XR (EFFEXOR-XR) 150 MG 24 hr capsule Take 150 mg by mouth daily with breakfast. 08/19/15   [provider]      Allergies    Penicillin g    Review of Systems   Review of Systems  Physical Exam Updated Vital Signs BP (!) 143/80 (BP Location: Right Arm)   Pulse 100   Temp 99.1 F (37.3 C) (Oral)   Resp 18   Ht 6\' 1"  (1.854 m)   Wt 100.7 kg   SpO2 96%   BMI 29.29 kg/m   Physical Exam Vitals and nursing note reviewed.  Constitutional:      General: He is not in acute distress.    Appearance: He is well-developed.  HENT:     Head: Normocephalic and atraumatic.     Jaw: No trismus.     Right Ear: Tympanic membrane,  ear canal and external ear normal.     Left Ear: Tympanic membrane, ear canal and external ear normal.     Nose: Congestion present. No mucosal edema or rhinorrhea.     Right Turbinates: Swollen.     Left Turbinates: Swollen.     Right Sinus: Maxillary sinus tenderness present. No frontal sinus tenderness.     Left Sinus: Maxillary sinus tenderness present. No frontal sinus tenderness.     Mouth/Throat:     Mouth: Mucous membranes are not dry.     Pharynx: Uvula midline. No oropharyngeal exudate, posterior oropharyngeal erythema or uvula swelling.     Tonsils: No tonsillar abscesses.  Eyes:     General:        Right eye: No discharge.        Left eye: No discharge.      Conjunctiva/sclera: Conjunctivae normal.  Cardiovascular:     Rate and Rhythm: Normal rate and regular rhythm.     Heart sounds: Normal heart sounds.  Pulmonary:     Effort: Pulmonary effort is normal. No respiratory distress.     Breath sounds: Normal breath sounds. No wheezing or rales.  Abdominal:     Palpations: Abdomen is soft.     Tenderness: There is no abdominal tenderness. There is no guarding or rebound.  Musculoskeletal:     Cervical back: Normal range of motion and neck supple.  Skin:    General: Skin is warm and dry.  Neurological:     Mental Status: He is alert.     ED Results / Procedures / Treatments   Labs (all labs ordered are listed, but only abnormal results are displayed) Labs Reviewed  GROUP A STREP BY PCR  RESP PANEL BY RT-PCR (RSV, FLU A&B, COVID)  [id number]    EKG None  Radiology DG Chest 2 View  Result Date: 09/15/2022 CLINICAL DATA:  Cough EXAM: CHEST - 2 VIEW COMPARISON:  11/12/2020 FINDINGS: Hypoventilatory change with chronic elevation of right diaphragm. Subsegmental atelectasis right base. No acute consolidation, pleural effusion or pneumothorax. Stable cardiomediastinal silhouette. IMPRESSION: No active cardiopulmonary disease. Chronic elevation of right diaphragm. Electronically Signed   By: Donavan Foil M.D.   On: 09/15/2022 21:44    Procedures Procedures    Medications Ordered in ED Medications  albuterol (VENTOLIN HFA) 108 (90 Base) MCG/ACT inhaler 2 puff (2 puffs Inhalation Given 09/15/22 2159)    ED Course/ Medical Decision Making/ A&P    Patient seen and examined. History obtained directly from patient. Work-up including labs, imaging, EKG ordered in triage, if performed, were reviewed.    Labs/EKG: Independently reviewed and interpreted.  This included: Flu, COVID, RSV and strep were negative.  Imaging: Independently reviewed and interpreted.  This included: Chest x-ray, agree negative  Medications/Fluids: Ordered: Albuterol  inhaler   Most recent vital signs reviewed and are as follows: BP (!) 140/78 (BP Location: Right Arm)   Pulse 98   Temp 99.1 F (37.3 C) (Oral)   Resp 20   Ht 6\' 1"  (1.854 m)   Wt 100.7 kg   SpO2 97%   BMI 29.29 kg/m   Initial impression: URI, likely viral bronchitis  Home treatment plan: OTC meds, Tessalon, Tussionex  Patient counseled on use of narcotic cough medications. Counseled not to combine these medications with others containing tylenol. Urged not to drink alcohol, drive, or perform any other activities that requires focus while taking these medications. The patient verbalizes understanding and agrees with the plan.  Return instructions  discussed with patient: Worsening shortness of breath, trouble breathing, increased work of breathing, high fevers, vomiting, new symptoms or other concerns.  Follow-up instructions discussed with patient: Follow-up with PCP in 3 to 5 days for recheck if not improving.                            Medical Decision Making Amount and/or Complexity of Data Reviewed Radiology: ordered.  Risk Prescription drug management.   Patient with upper respiratory symptoms, most bothersome being cough.  Chest x-ray negative for pneumonia.  Viral panel testing negative.  Will continue to treat symptomatically and will trial medications for viral bronchitis.  No indication for antibiotics at this time.  The patient's vital signs, pertinent lab work and imaging were reviewed and interpreted as discussed in the ED course. Hospitalization was considered for further testing, treatments, or serial exams/observation. However as patient is well-appearing, has a stable exam, and reassuring studies today, I do not feel that they warrant admission at this time. This plan was discussed with the patient who verbalizes agreement and comfort with this plan and seems reliable and able to return to the Emergency Department with worsening or changing symptoms.           Final Clinical Impression(s) / ED Diagnoses Final diagnoses:  Bronchitis  Acute cough  Viral respiratory infection    Rx / DC Orders ED Discharge Orders          Ordered    benzonatate (TESSALON) 100 MG capsule  Every 8 hours        09/15/22 2211    chlorpheniramine-HYDROcodone (TUSSIONEX) 10-8 MG/5ML  2 times daily        09/15/22 2211              Carlisle Cater, PA-C 09/15/22 2216    Gareth Morgan, MD 09/16/22 1144

## 2022-09-19 ENCOUNTER — Emergency Department (HOSPITAL_BASED_OUTPATIENT_CLINIC_OR_DEPARTMENT_OTHER): Payer: Medicare Other | Admitting: Radiology

## 2022-09-19 ENCOUNTER — Encounter (HOSPITAL_BASED_OUTPATIENT_CLINIC_OR_DEPARTMENT_OTHER): Payer: Self-pay | Admitting: *Deleted

## 2022-09-19 ENCOUNTER — Other Ambulatory Visit: Payer: Self-pay

## 2022-09-19 ENCOUNTER — Emergency Department (HOSPITAL_BASED_OUTPATIENT_CLINIC_OR_DEPARTMENT_OTHER)
Admission: EM | Admit: 2022-09-19 | Discharge: 2022-09-19 | Disposition: A | Discharge status: Home / Self Care | Payer: Medicare Other | Attending: Emergency Medicine | Admitting: Emergency Medicine

## 2022-09-19 DIAGNOSIS — J40 Bronchitis, not specified as acute or chronic: Secondary | ICD-10-CM | POA: Diagnosis not present

## 2022-09-19 DIAGNOSIS — R059 Cough, unspecified: Secondary | ICD-10-CM | POA: Diagnosis present

## 2022-09-19 DIAGNOSIS — Z1152 Encounter for screening for COVID-19: Secondary | ICD-10-CM | POA: Insufficient documentation

## 2022-09-19 DIAGNOSIS — H1033 Unspecified acute conjunctivitis, bilateral: Secondary | ICD-10-CM | POA: Diagnosis not present

## 2022-09-19 DIAGNOSIS — R0602 Shortness of breath: Secondary | ICD-10-CM | POA: Diagnosis not present

## 2022-09-19 DIAGNOSIS — I10 Essential (primary) hypertension: Secondary | ICD-10-CM | POA: Diagnosis not present

## 2022-09-19 DIAGNOSIS — Z79899 Other long term (current) drug therapy: Secondary | ICD-10-CM | POA: Insufficient documentation

## 2022-09-19 LAB — RESP PANEL BY RT-PCR (RSV, FLU A&B, COVID)  RVPGX2
Influenza A by PCR: NEGATIVE
Influenza B by PCR: NEGATIVE
Resp Syncytial Virus by PCR: NEGATIVE
SARS Coronavirus 2 by RT PCR: NEGATIVE

## 2022-09-19 MED ORDER — ERYTHROMYCIN 5 MG/GM OP OINT: TOPICAL_OINTMENT | Freq: Four times a day (QID) | OPHTHALMIC | Status: DC

## 2022-09-19 MED ORDER — HYDROCOD POLI-CHLORPHE POLI ER 10-8 MG/5ML PO SUER
5.0000 mL | Freq: Once | ORAL | Status: AC
  Administered 2022-09-19: 5 mL via ORAL
  Filled 2022-09-19: qty 5

## 2022-09-19 MED ORDER — HYDROCOD POLI-CHLORPHE POLI ER 10-8 MG/5ML PO SUER: 5.0000 mL | Freq: Two times a day (BID) | ORAL | 0 refills | Status: DC

## 2022-09-19 MED ORDER — ERYTHROMYCIN 5 MG/GM OP OINT
TOPICAL_OINTMENT | Freq: Four times a day (QID) | OPHTHALMIC | Status: DC
  Administered 2022-09-19: 1 via OPHTHALMIC
  Filled 2022-09-19: qty 3.5

## 2022-09-19 MED ORDER — IPRATROPIUM-ALBUTEROL 0.5-2.5 (3) MG/3ML IN SOLN
3.0000 mL | RESPIRATORY_TRACT | Status: AC
  Administered 2022-09-19 (×3): 3 mL via RESPIRATORY_TRACT
  Filled 2022-09-19 (×3): qty 3

## 2022-09-19 MED ORDER — PREDNISONE 20 MG PO TABS
40.0000 mg | ORAL_TABLET | Freq: Once | ORAL | Status: AC
  Administered 2022-09-19: 40 mg via ORAL
  Filled 2022-09-19: qty 2

## 2022-09-19 MED ORDER — PREDNISONE 20 MG PO TABS: 20.0000 mg | ORAL_TABLET | Freq: Every day | ORAL | 0 refills | Status: AC

## 2022-09-19 MED ORDER — DOXYCYCLINE HYCLATE 100 MG PO CAPS: 100.0000 mg | ORAL_CAPSULE | Freq: Two times a day (BID) | ORAL | 0 refills | Status: AC

## 2022-09-19 NOTE — ED Notes (Signed)
RT educated pt on nebulizer treatment and medication being administered via neb. Pt respiratory status stable w/no distress noted at this time. Pt sats 94% on RA BLBS clear/dim/coarse (see flowsheet). RT will continue to monitor while at [facility] ED.

## 2022-09-19 NOTE — ED Provider Notes (Signed)
[facility] Provider Note  CSN: [id number] Arrival date & time: 09/19/22 [id number]  Chief Complaint(s) Cough  HPI Tyler Tate is a 67 y.o. male with a past medical history listed below who presents to the emergency department for persistent cough productive of sputum.  Patient has sinus congestion and developed bilateral conjunctivitis several days ago.  Patient has had a cough for 1 to 2 weeks.  He was seen on March 21 and diagnosed with bronchitis.  He tested negative for COVID, strep, pneumonia he was prescribed Tussionex, Tessalon Perles and given albuterol inhaler.    No history of asthma, COPD.  The history is provided by the patient.    Past Medical History Past Medical History:  Diagnosis Date   Hypertension    Patient Active Problem List   Diagnosis Date Noted   Hip fracture ([place]) 11/12/2020   Fall at home, initial encounter 11/12/2020   Leukocytosis 11/12/2020   Alcohol use 11/12/2020   GERD (gastroesophageal reflux disease) 11/12/2020   Hyperlipidemia 11/12/2020   Essential hypertension 11/12/2020   Home Medication(s) Prior to Admission medications   Medication Sig Start Date End Date Taking? Authorizing Provider  doxycycline (VIBRAMYCIN) 100 MG capsule Take 1 capsule (100 mg total) by mouth 2 (two) times daily for 7 days. 09/19/22 09/26/22 Yes Akili Corsetti, Grayce Sessions, MD  predniSONE (DELTASONE) 20 MG tablet Take 1 tablet (20 mg total) by mouth daily for 5 days. 09/19/22 09/24/22 Yes Tiffany Calmes, Grayce Sessions, MD  amLODipine (NORVASC) 5 MG tablet Take 5 mg by mouth daily. 09/11/15   [provider]  atorvastatin (LIPITOR) 40 MG tablet Take 40 mg by mouth daily. 05/01/15   [provider]  benzonatate (TESSALON) 100 MG capsule Take 1 capsule (100 mg total) by mouth every 8 (eight) hours. 09/15/22   Carlisle Cater, PA-C  chlorpheniramine-HYDROcodone (TUSSIONEX) 10-8 MG/5ML Take 5 mLs by mouth 2 (two) times daily. 09/19/22    Fatima Blank, MD  glucosamine-chondroitin 500-400 MG tablet Take 3 tablets by mouth daily.    [provider]  losartan-hydrochlorothiazide (HYZAAR) 100-25 MG tablet Take 1 tablet by mouth daily. 05/10/15   [provider]  metoprolol succinate (TOPROL-XL) 50 MG 24 hr tablet Take 50 mg by mouth daily. 05/01/15   [provider]  Multiple Vitamins-Minerals (MULTIVITAMIN ADULT PO) Take 1 tablet by mouth daily.    [provider]  omeprazole (PRILOSEC) 20 MG capsule Take 1 capsule (20 mg total) by mouth daily. 11/16/20 04/27/22  Antonieta Pert, MD  venlafaxine XR (EFFEXOR-XR) 150 MG 24 hr capsule Take 150 mg by mouth daily with breakfast. 08/19/15   [provider]                                                                                                                                    Allergies Penicillin g  Review of Systems Review of Systems As noted in  HPI  Physical Exam Vital Signs  I have reviewed the triage vital signs BP (!) 141/73   Pulse (!) 102   Temp 99.2 F (37.3 C) (Oral)   Resp 20   Ht 6\' 1"  (1.854 m)   Wt 95.3 kg   SpO2 95%   BMI 27.71 kg/m   Physical Exam Vitals reviewed.  Constitutional:      General: He is not in acute distress.    Appearance: He is well-developed. He is not diaphoretic.  HENT:     Head: Normocephalic and atraumatic.     Nose: Nose normal.  Eyes:     General: No scleral icterus.       Right eye: No discharge.        Left eye: No discharge.     Conjunctiva/sclera:     Right eye: Right conjunctiva is injected.     Left eye: Left conjunctiva is injected.     Pupils: Pupils are equal, round, and reactive to light.  Cardiovascular:     Rate and Rhythm: Normal rate and regular rhythm.     Heart sounds: No murmur heard.    No friction rub. No gallop.  Pulmonary:     Effort: Pulmonary effort is normal. No respiratory distress.     Breath sounds: No stridor. Examination of the  right-lower field reveals wheezing. Wheezing present. No rales.  Abdominal:     General: There is no distension.     Palpations: Abdomen is soft.     Tenderness: There is no abdominal tenderness.  Musculoskeletal:        General: No tenderness.     Cervical back: Normal range of motion and neck supple.  Skin:    General: Skin is warm and dry.     Findings: No erythema or rash.  Neurological:     Mental Status: He is alert and oriented to person, place, and time.     ED Results and Treatments Labs (all labs ordered are listed, but only abnormal results are displayed) Labs Reviewed  RESP PANEL BY RT-PCR (RSV, FLU A&B, COVID)  [id number]                                                                                                                         EKG  EKG Interpretation  Date/Time:    Ventricular Rate:    PR Interval:    QRS Duration:   QT Interval:    QTC Calculation:   R Axis:     Text Interpretation:         Radiology DG Chest 2 View  Result Date: 09/19/2022 CLINICAL DATA:  67 year old male with nonproductive cough since last week. Shortness of breath on exertion. EXAM: CHEST - 2 VIEW COMPARISON:  Chest radiographs 09/15/2022 and earlier. FINDINGS: Ongoing low lung volumes. Mediastinal contours are stable and within normal limits. Mild streaky, platelike opacity at both lung bases most resembles atelectasis. No pneumothorax, pulmonary edema, pleural effusion, or other confluent  opacity. Calcified aortic atherosclerosis. Visualized tracheal air column is within normal limits. No acute osseous abnormality identified. Negative visible bowel gas. IMPRESSION: Continued low lung volumes with bibasilar atelectasis. No other acute cardiopulmonary abnormality. Electronically Signed   By: Genevie Ann M.D.   On: 09/19/2022 06:19    Medications Ordered in ED Medications  erythromycin ophthalmic ointment (has no administration in time range)  ipratropium-albuterol (DUONEB) 0.5-2.5  (3) MG/3ML nebulizer solution 3 mL (3 mLs Nebulization Given 09/19/22 0655)  predniSONE (DELTASONE) tablet 40 mg (40 mg Oral Given 09/19/22 0614)  chlorpheniramine-HYDROcodone (TUSSIONEX) 10-8 MG/5ML suspension 5 mL (5 mLs Oral Given 09/19/22 [id number])                                                                                                                                     Procedures Procedures  (including critical care time)  Medical Decision Making / ED Course  Click here for ABCD2, HEART and other calculators  Medical Decision Making Amount and/or Complexity of Data Reviewed Labs: ordered. Decision-making details documented in ED Course. Radiology: ordered and independent interpretation performed. Decision-making details documented in ED Course.  Risk Prescription drug management.   This patient presents to the ED for concern of cough, this involves an extensive number of treatment options, and is a complaint that carries with it a high risk of complications and morbidity. The differential diagnosis includes but not limited to URI/Bronchitis. Will r/o PNA. Patient also has conjunctivitis.  Initial intervention:  Duoneb Prednisone Tussionex  Work up: [employer] and/or imaging independently interpreted by me) CXR hypoinflated lungs, but negative for PNA, pulmonary edema, PTX Viral panel negative  Reassessment: Cough and SOB improved.      Final Clinical Impression(s) / ED Diagnoses Final diagnoses:  Bronchitis  Acute conjunctivitis of both eyes, unspecified acute conjunctivitis type   The patient appears reasonably screened and/or stabilized for discharge and I doubt any other medical condition or other [facility] requiring further screening, evaluation, or treatment in the ED at this time. I have discussed the findings, Dx and Tx plan with the patient/family who expressed understanding and agree(s) with the plan. Discharge instructions discussed at length. The patient/family was  given strict return precautions who verbalized understanding of the instructions. No further questions at time of discharge.  Disposition: Discharge  Condition: Good  ED Discharge Orders          Ordered    doxycycline (VIBRAMYCIN) 100 MG capsule  2 times daily        09/19/22 0730    predniSONE (DELTASONE) 20 MG tablet  Daily        09/19/22 0730    chlorpheniramine-HYDROcodone (TUSSIONEX) 10-8 MG/5ML  2 times daily        09/19/22 0730              Follow Up: Burnard Bunting, MD [address] [place] [id number] [phone]  Call  to schedule  an appointment for close follow up            This chart was dictated using voice recognition software.  Despite best efforts to proofread,  errors can occur which can change the documentation meaning.    Fatima Blank, MD 09/19/22 [phone]

## 2022-09-19 NOTE — ED Notes (Signed)
Discharge paperwork given and verbally understood. 

## 2022-09-19 NOTE — ED Triage Notes (Addendum)
Dry cough since last Tuesday. Denies fevers. States he feels weak. Pt is sob on exertion to room. Pt with faint inspiratory wheezing noted. Sats 94%.  Was seen a week ago for same symptoms. Bilateral sclera red.

## 2022-09-19 NOTE — Discharge Instructions (Signed)
Continue to use your albuterol inhaler as previously directed. Take Antibiotic (Doxycycline) and prednisone for cough as directed.  For eye infection: erythromycin ophthalmic ointment Both Eyes, Every 6 hours, First dose (after last modification) on Mon 09/19/22 at 0730, For 5 days

## 2023-01-02 ENCOUNTER — Inpatient Hospital Stay: Payer: Medicare Other

## 2023-03-09 IMAGING — DX DG CHEST 1V PORT
1 series · 1 of 1 positions shown · non-contrast
Comparison: None.

CLINICAL DATA: Preop RIGHT hip fracture

EXAM:
PORTABLE CHEST 1 VIEW

[chest]
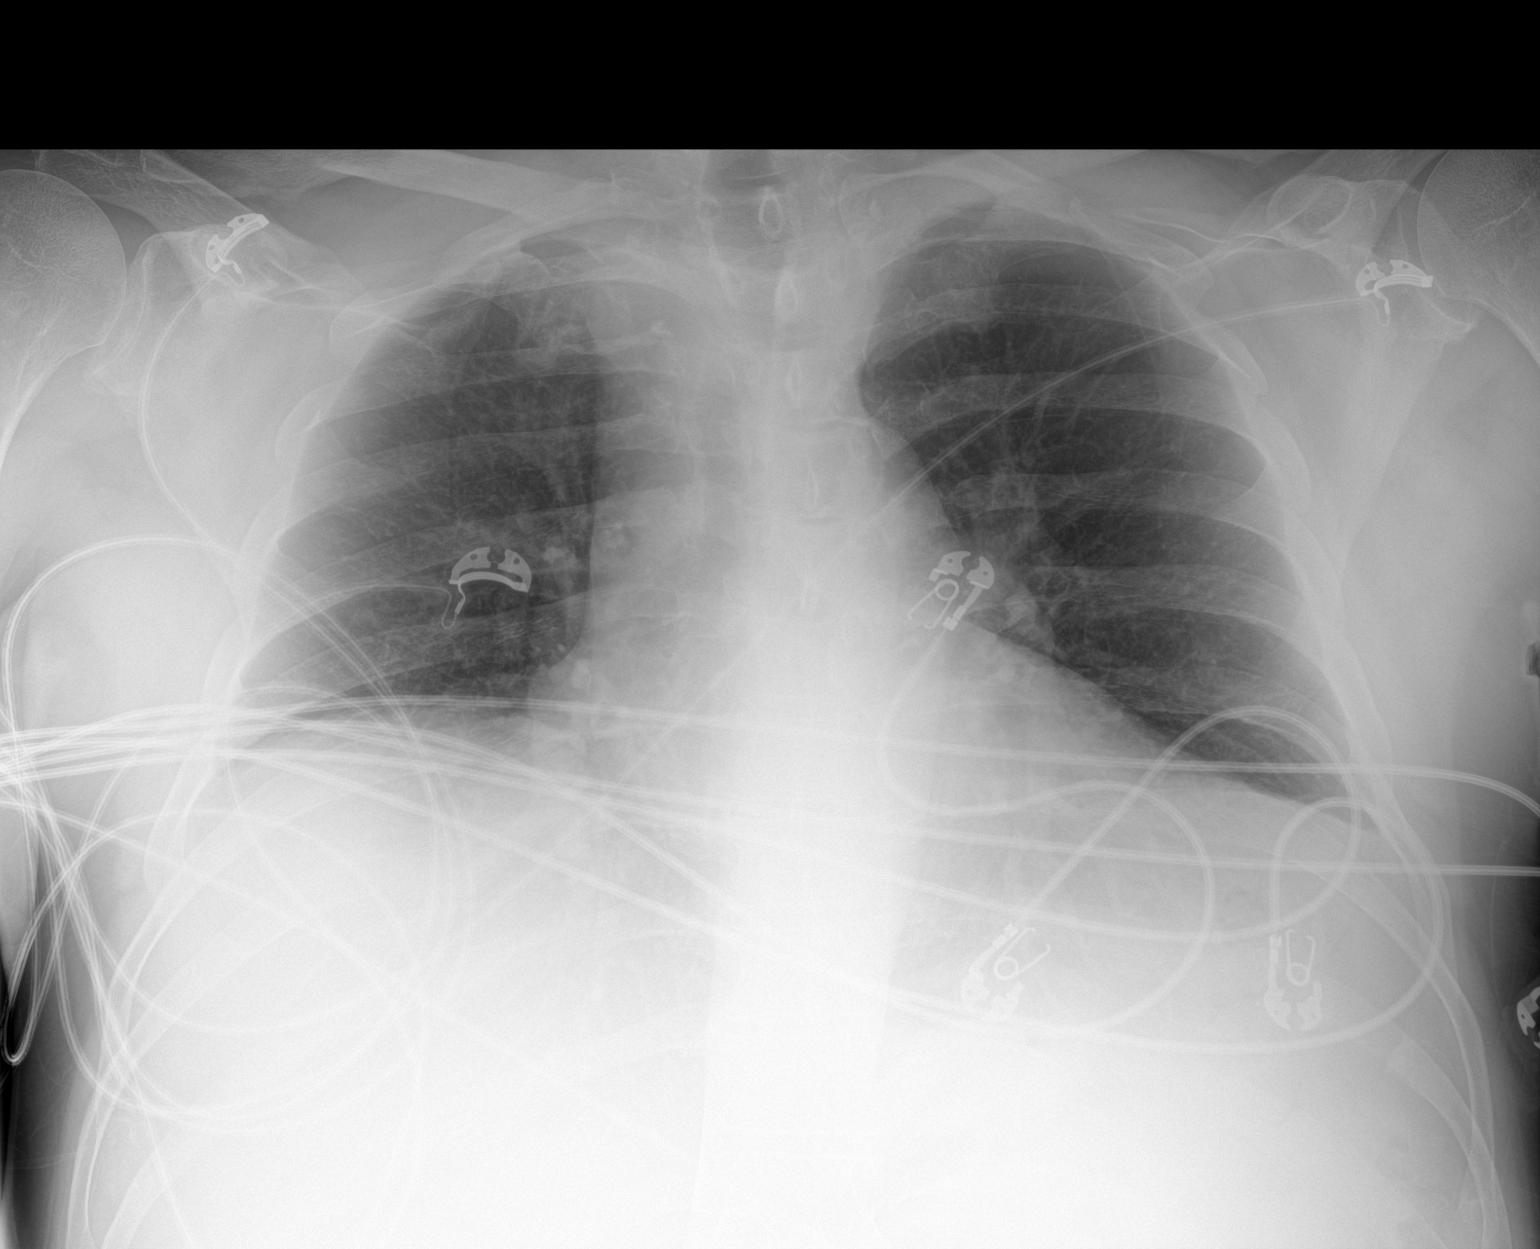

[1 of 1 positions shown; findings below may reference images not displayed]

FINDINGS: Normal mediastinum and cardiac silhouette. Normal pulmonary
vasculature. No evidence of effusion, infiltrate, or pneumothorax.
No acute bony abnormality.
IMPRESSION: Low lung volumes.  No acute cardiopulmonary process.

## 2023-03-09 IMAGING — CT CT HIP*R* W/O CM
2 of 3 series · 15 of 46 positions shown, 17 images · non-contrast
Comparison: Pelvis and right femur x-rays from same day.

CLINICAL DATA: Right hip fracture after fall.

EXAM:
CT OF THE RIGHT HIP WITHOUT CONTRAST
TECHNIQUE: Multidetector CT imaging of the right hip was performed according to
the standard protocol. Multiplanar CT image reconstructions were
also generated.

[Series 5: hip 2.0 (person_name) (person_name) · axial · 0.49mm/px · z∈[-862,-534]mm · 12 of 190 slices shown, 14 images]
[im 13/190  soft-tissue]
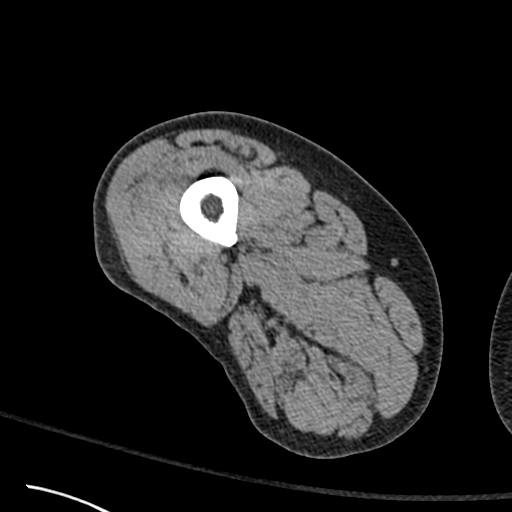
[im 13/190  bone]
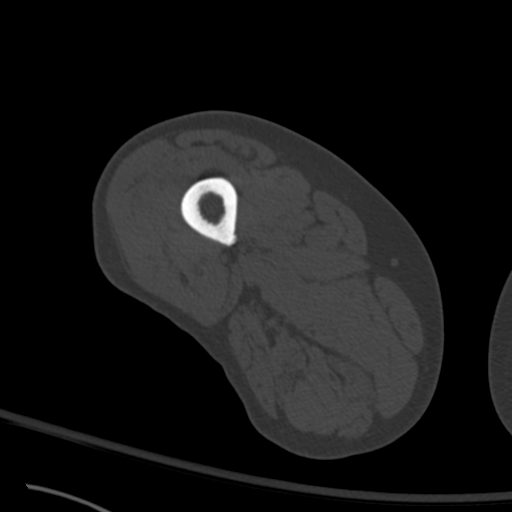
[im 25/190  soft-tissue]
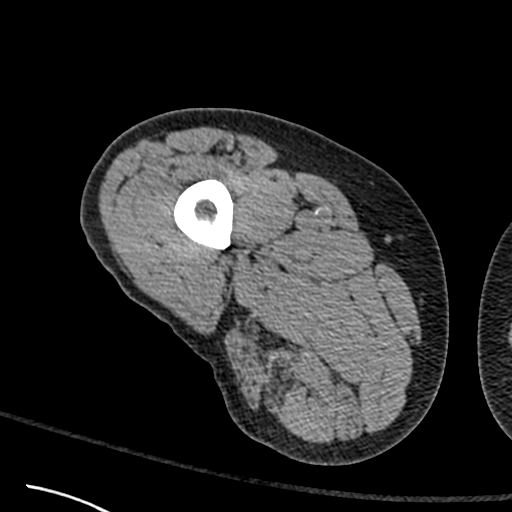
[im 43/190  soft-tissue]
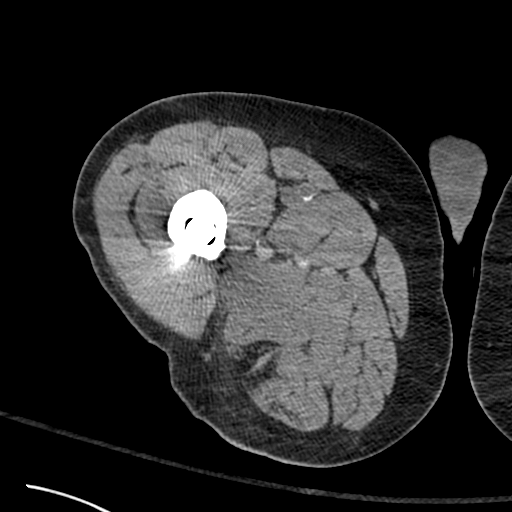
[im 55/190  soft-tissue]
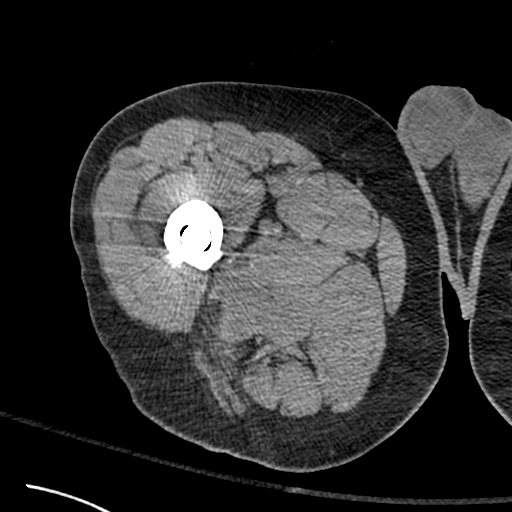
[im 74/190  soft-tissue]
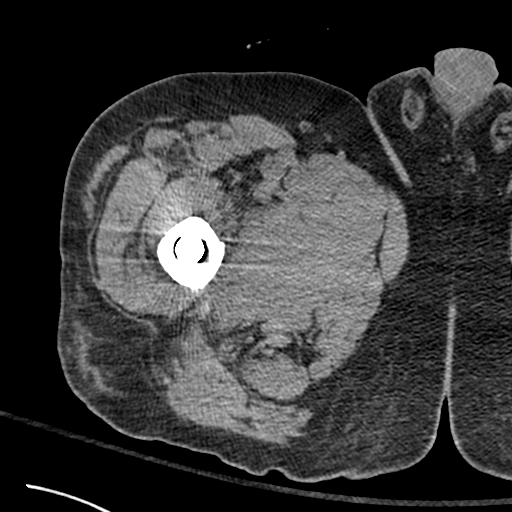
[im 86/190  soft-tissue]
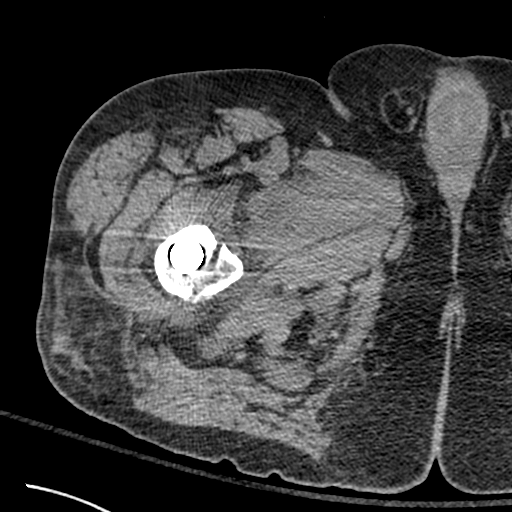
[im 104/190  soft-tissue]
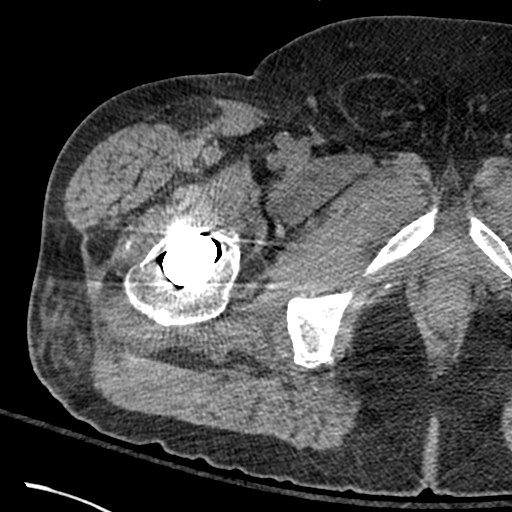
[im 116/190  soft-tissue]
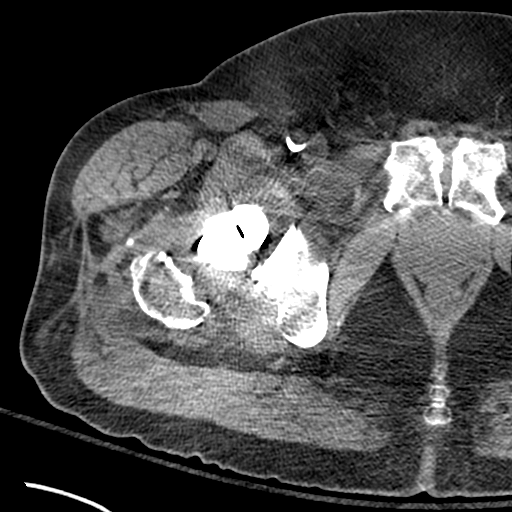
[im 135/190  soft-tissue]
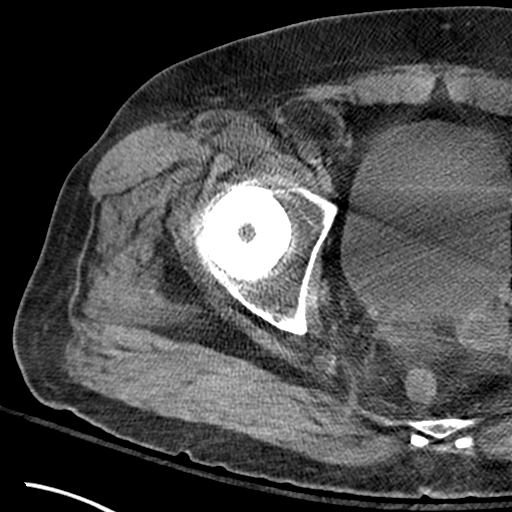
[im 135/190  bone]
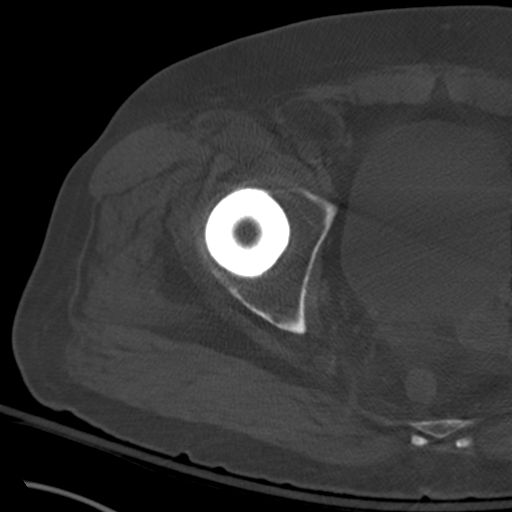
[im 147/190  soft-tissue]
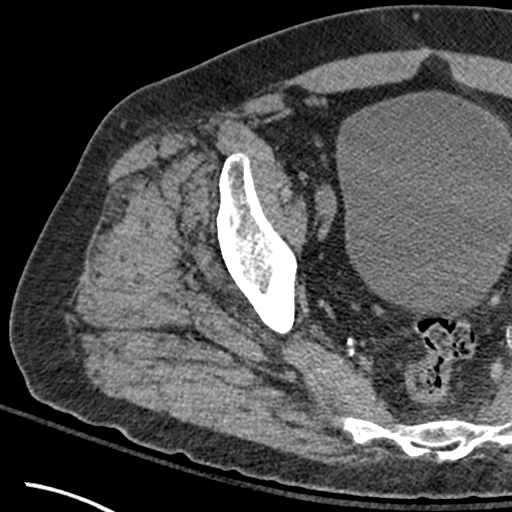
[im 165/190  soft-tissue]
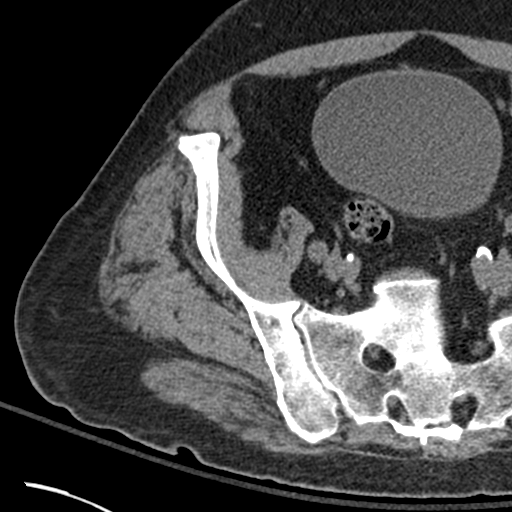
[im 177/190  soft-tissue]
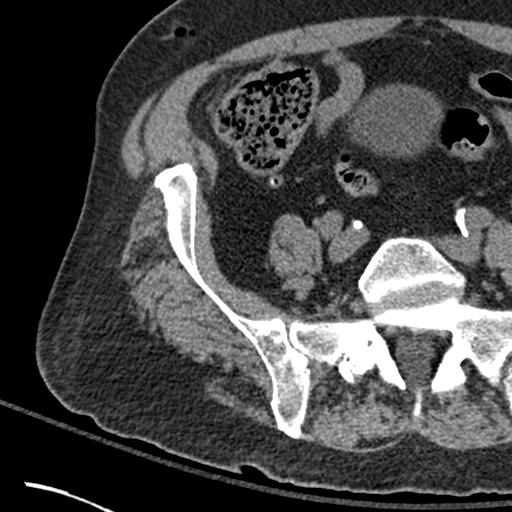

[Series 8: hip 2.0 cor. st · coronal · 0.39mm/px · 3 of 124 slices shown]
[im 42/124  soft-tissue]
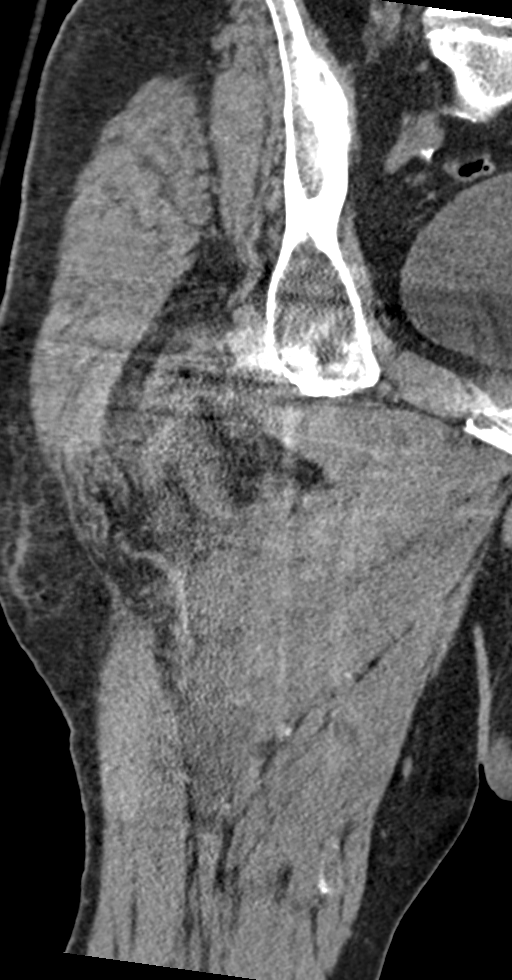
[im 55/124  soft-tissue]
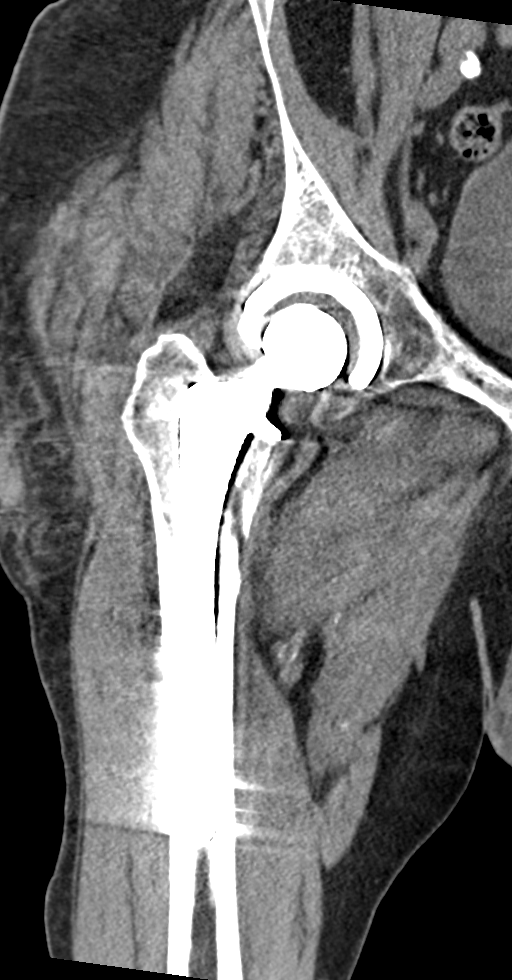
[im 69/124  soft-tissue]
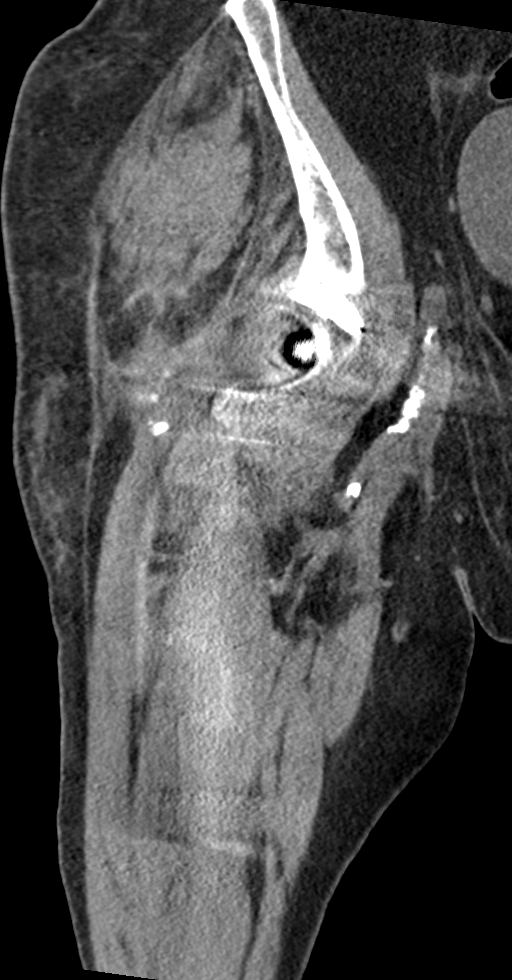

[15 of 46 positions shown; findings below may reference images not displayed]

FINDINGS: Bones/Joint/Cartilage

Prior right total hip arthroplasty. Acute periprosthetic fracture
again noted with longitudinal component through the right lesser
trochanter paralleling the femoral stem, extending superiorly into
the posterior greater tuberosity (series 9, image 33). There is up
to 4 mm distraction of the fracture fragments at the base of the
lesser trochanter. There is an oblique nondisplaced transverse
component through the posterior and lateral subtrochanteric femur
(series 7, image 75; series 9, image 35).

No dislocation. No joint effusion. Right greater trochanteric
lipohemobursitis.

Ligaments

Ligaments are suboptimally evaluated by CT.

Muscles and Tendons
Grossly intact.

Soft tissue
No fluid collection or hematoma.  No soft tissue mass.
IMPRESSION: 1. Prior right total hip arthroplasty with acute periprosthetic
inter- and subtrochanteric fracture as described above.
2. Right greater trochanteric lipohemobursitis.

## 2023-11-28 ENCOUNTER — Emergency Department (HOSPITAL_BASED_OUTPATIENT_CLINIC_OR_DEPARTMENT_OTHER)

## 2023-11-28 ENCOUNTER — Encounter (HOSPITAL_BASED_OUTPATIENT_CLINIC_OR_DEPARTMENT_OTHER): Payer: Self-pay | Admitting: Emergency Medicine

## 2023-11-28 ENCOUNTER — Inpatient Hospital Stay (HOSPITAL_BASED_OUTPATIENT_CLINIC_OR_DEPARTMENT_OTHER)
Admission: EM | Admit: 2023-11-28 | Discharge: 2023-11-30 | DRG: 440 | Disposition: A | Discharge status: Home / Self Care | Attending: Internal Medicine | Admitting: Internal Medicine

## 2023-11-28 ENCOUNTER — Other Ambulatory Visit: Payer: Self-pay

## 2023-11-28 DIAGNOSIS — K219 Gastro-esophageal reflux disease without esophagitis: Secondary | ICD-10-CM | POA: Diagnosis present

## 2023-11-28 DIAGNOSIS — Z96643 Presence of artificial hip joint, bilateral: Secondary | ICD-10-CM | POA: Diagnosis present

## 2023-11-28 DIAGNOSIS — F32A Depression, unspecified: Secondary | ICD-10-CM | POA: Diagnosis present

## 2023-11-28 DIAGNOSIS — D72829 Elevated white blood cell count, unspecified: Secondary | ICD-10-CM | POA: Diagnosis present

## 2023-11-28 DIAGNOSIS — K8500 Idiopathic acute pancreatitis without necrosis or infection: Secondary | ICD-10-CM | POA: Diagnosis present

## 2023-11-28 DIAGNOSIS — K859 Acute pancreatitis without necrosis or infection, unspecified: Secondary | ICD-10-CM | POA: Diagnosis not present

## 2023-11-28 DIAGNOSIS — E782 Mixed hyperlipidemia: Secondary | ICD-10-CM | POA: Diagnosis not present

## 2023-11-28 DIAGNOSIS — F17290 Nicotine dependence, other tobacco product, uncomplicated: Secondary | ICD-10-CM | POA: Diagnosis present

## 2023-11-28 DIAGNOSIS — R109 Unspecified abdominal pain: Secondary | ICD-10-CM | POA: Diagnosis present

## 2023-11-28 DIAGNOSIS — R1013 Epigastric pain: Secondary | ICD-10-CM

## 2023-11-28 DIAGNOSIS — Z79899 Other long term (current) drug therapy: Secondary | ICD-10-CM

## 2023-11-28 DIAGNOSIS — F419 Anxiety disorder, unspecified: Secondary | ICD-10-CM | POA: Diagnosis present

## 2023-11-28 DIAGNOSIS — I10 Essential (primary) hypertension: Secondary | ICD-10-CM | POA: Diagnosis present

## 2023-11-28 DIAGNOSIS — E785 Hyperlipidemia, unspecified: Secondary | ICD-10-CM | POA: Diagnosis present

## 2023-11-28 DIAGNOSIS — Z8679 Personal history of other diseases of the circulatory system: Secondary | ICD-10-CM | POA: Diagnosis not present

## 2023-11-28 DIAGNOSIS — Z880 Allergy status to penicillin: Secondary | ICD-10-CM | POA: Diagnosis not present

## 2023-11-28 HISTORY — DX: Hyperlipidemia, unspecified: E78.5

## 2023-11-28 LAB — HEPATIC FUNCTION PANEL
ALT: 20 U/L (ref 0–44)
AST: 21 U/L (ref 15–41)
Albumin: 4.5 g/dL (ref 3.5–5.0)
Alkaline Phosphatase: 103 U/L (ref 38–126)
Bilirubin, Direct: 0.2 mg/dL (ref 0.0–0.2)
Indirect Bilirubin: 0.2 mg/dL — ABNORMAL LOW (ref 0.3–0.9)
Total Bilirubin: 0.4 mg/dL (ref 0.0–1.2)
Total Protein: 7.5 g/dL (ref 6.5–8.1)

## 2023-11-28 LAB — CBC
HCT: 39 % (ref 39.0–52.0)
Hemoglobin: 13.1 g/dL (ref 13.0–17.0)
MCH: 31 pg (ref 26.0–34.0)
MCHC: 33.6 g/dL (ref 30.0–36.0)
MCV: 92.2 fL (ref 80.0–100.0)
Platelets: 320 10*3/uL (ref 150–400)
RBC: 4.23 MIL/uL (ref 4.22–5.81)
RDW: 12.4 % (ref 11.5–15.5)
WBC: 14.4 10*3/uL — ABNORMAL HIGH (ref 4.0–10.5)
nRBC: 0 % (ref 0.0–0.2)

## 2023-11-28 LAB — BASIC METABOLIC PANEL WITH GFR
Anion gap: 13 (ref 5–15)
BUN: 19 mg/dL (ref 8–23)
CO2: 24 mmol/L (ref 22–32)
Calcium: 9.7 mg/dL (ref 8.9–10.3)
Chloride: 102 mmol/L (ref 98–111)
Creatinine, Ser: 0.93 mg/dL (ref 0.61–1.24)
GFR, Estimated: >60 mL/min (ref >60)
Glucose, Bld: 117 mg/dL — ABNORMAL HIGH (ref 70–99)
Potassium: 4.3 mmol/L (ref 3.5–5.1)
Sodium: 139 mmol/L (ref 135–145)

## 2023-11-28 LAB — ETHANOL: Alcohol, Ethyl (B): <15 mg/dL (ref <15)

## 2023-11-28 LAB — TROPONIN T, HIGH SENSITIVITY
Troponin T High Sensitivity: <15 ng/L (ref <19)
Troponin T High Sensitivity: <15 ng/L (ref <19)

## 2023-11-28 LAB — URINE DRUG SCREEN
Amphetamines: NOT DETECTED
Barbiturates: NOT DETECTED
Benzodiazepines: NOT DETECTED
Cocaine: NOT DETECTED
Fentanyl: NOT DETECTED
Methadone Scn, Ur: NOT DETECTED
Opiates: NOT DETECTED
Tetrahydrocannabinol: NOT DETECTED

## 2023-11-28 LAB — MAGNESIUM: Magnesium: 2 mg/dL (ref 1.7–2.4)

## 2023-11-28 LAB — TRIGLYCERIDES: Triglycerides: 77 mg/dL (ref <150)

## 2023-11-28 LAB — LIPASE, BLOOD: Lipase: 2202 U/L — ABNORMAL HIGH (ref 11–51)

## 2023-11-28 MED ORDER — HYDROMORPHONE HCL 1 MG/ML IJ SOLN
0.5000 mg | INTRAMUSCULAR | Status: DC | PRN
  Administered 2023-11-28 – 2023-11-29 (×2): 0.5 mg via INTRAVENOUS
  Filled 2023-11-28 (×2): qty 0.5

## 2023-11-28 MED ORDER — SODIUM CHLORIDE 0.9 % IV BOLUS
1000.0000 mL | Freq: Once | INTRAVENOUS | Status: AC
  Administered 2023-11-28: 1000 mL via INTRAVENOUS

## 2023-11-28 MED ORDER — ACETAMINOPHEN 325 MG PO TABS
650.0000 mg | ORAL_TABLET | Freq: Four times a day (QID) | ORAL | Status: DC | PRN
Start: 2023-11-28 — End: 2023-11-30
  Administered 2023-11-29 – 2023-11-30 (×4): 650 mg via ORAL
  Filled 2023-11-28 (×4): qty 2

## 2023-11-28 MED ORDER — SODIUM CHLORIDE 0.9 % IV BOLUS: 250.0000 mL | Freq: Once | INTRAVENOUS | Status: DC

## 2023-11-28 MED ORDER — PANTOPRAZOLE SODIUM 40 MG IV SOLR
40.0000 mg | Freq: Once | INTRAVENOUS | Status: AC
  Administered 2023-11-28: 40 mg via INTRAVENOUS
  Filled 2023-11-28: qty 10

## 2023-11-28 MED ORDER — NALOXONE HCL 0.4 MG/ML IJ SOLN: 0.4000 mg | INTRAMUSCULAR | Status: DC | PRN

## 2023-11-28 MED ORDER — ALUM & MAG HYDROXIDE-SIMETH 200-200-20 MG/5ML PO SUSP
30.0000 mL | Freq: Once | ORAL | Status: AC
  Administered 2023-11-28: 30 mL via ORAL
  Filled 2023-11-28: qty 30

## 2023-11-28 MED ORDER — HYDROMORPHONE HCL 1 MG/ML IJ SOLN
0.5000 mg | Freq: Once | INTRAMUSCULAR | Status: AC
  Administered 2023-11-28: 0.5 mg via INTRAVENOUS
  Filled 2023-11-28: qty 1

## 2023-11-28 MED ORDER — IOHEXOL 300 MG/ML  SOLN
100.0000 mL | Freq: Once | INTRAMUSCULAR | Status: AC | PRN
Start: 2023-11-28 — End: 2023-11-28
  Administered 2023-11-28: 100 mL via INTRAVENOUS

## 2023-11-28 MED ORDER — ONDANSETRON HCL 4 MG/2ML IJ SOLN
4.0000 mg | Freq: Once | INTRAMUSCULAR | Status: AC
  Administered 2023-11-28: 4 mg via INTRAVENOUS
  Filled 2023-11-28: qty 2

## 2023-11-28 MED ORDER — ACETAMINOPHEN 650 MG RE SUPP: 650.0000 mg | Freq: Four times a day (QID) | RECTAL | Status: DC | PRN

## 2023-11-28 MED ORDER — HYDROMORPHONE HCL 1 MG/ML IJ SOLN
0.5000 mg | INTRAMUSCULAR | Status: AC | PRN
  Administered 2023-11-28: 0.5 mg via INTRAVENOUS
  Filled 2023-11-28: qty 1

## 2023-11-28 MED ORDER — LACTATED RINGERS IV SOLN: INTRAVENOUS | Status: AC

## 2023-11-28 MED ORDER — ONDANSETRON HCL 4 MG/2ML IJ SOLN: 4.0000 mg | Freq: Four times a day (QID) | INTRAMUSCULAR | Status: DC | PRN

## 2023-11-28 NOTE — Progress Notes (Signed)
 New admission to 6N room 1.  Patient alert and oriented x 4.  VSS, reports BP is high because he did not take BP meds this morning.  Awaiting for new orders.

## 2023-11-28 NOTE — ED Notes (Signed)
 Kim with cl called for transport

## 2023-11-28 NOTE — H&P (Signed)
 History and Physical      Tyler Tate [id number] DOB: [birth year] DOA: 11/28/2023; DOS: 11/28/2023  PCP: Suan Elm, MD *** Patient coming from: home ***  I have personally briefly reviewed patient's old medical records in [facility]  Chief Complaint: ***  HPI: Tyler Tate is a 68 y.o. male with medical history significant for *** who is admitted to [facility] on 11/28/2023 with *** after presenting from home*** to [facility] ED complaining of ***.    ***       ***   ED Course:  Vital signs in the ED were notable for the following: ***  Labs were notable for the following: ***  Per my interpretation, EKG in ED demonstrated the following:  ***  Imaging in the ED, per corresponding formal radiology read, was notable for the following:  ***  While in the ED, the following were administered: ***  Subsequently, the patient was admitted  ***  ***red    Review of Systems: As per HPI otherwise 10 point review of systems negative.   Past Medical History:  Diagnosis Date   Hypertension     Past Surgical History:  Procedure Laterality Date   JOINT REPLACEMENT     hip right and left    Social History:  reports that he has been smoking cigars. He has never used smokeless tobacco. He reports that he does not currently use alcohol. He reports that he does not use drugs.   Allergies  Allergen Reactions   Penicillin G Rash    History reviewed. No pertinent family history.  Family history reviewed and not pertinent ***   Prior to Admission medications   Medication Sig Start Date End Date Taking? Authorizing Provider  amLODipine  (NORVASC ) 5 MG tablet Take 5 mg by mouth daily. 09/11/15  Yes [provider]  atorvastatin  (LIPITOR) 40 MG tablet Take 40 mg by mouth daily. 05/01/15  Yes [provider]  glucosamine-chondroitin 500-400 MG tablet Take 3 tablets by mouth daily.   Yes [provider]   losartan -hydrochlorothiazide  (HYZAAR) 100-25 MG tablet Take 1 tablet by mouth daily. 05/10/15  Yes [provider]  metoprolol  succinate (TOPROL -XL) 50 MG 24 hr tablet Take 50 mg by mouth daily. 05/01/15  Yes [provider]  Multiple Vitamins-Minerals (MULTIVITAMIN ADULT PO) Take 1 tablet by mouth daily.   Yes [provider]  omeprazole  (PRILOSEC) 20 MG capsule Take 1 capsule (20 mg total) by mouth daily. 11/16/20 11/28/23 Yes Lesa Rape, MD  Tadalafil 2.5 MG TABS Take 1 tablet by mouth daily. 11/15/23  Yes [provider]  TURMERIC PO Take 1 tablet by mouth daily.   Yes [provider]  venlafaxine  XR (EFFEXOR -XR) 150 MG 24 hr capsule Take 150 mg by mouth daily with breakfast. 08/19/15  Yes [provider]  benzonatate  (TESSALON ) 100 MG capsule Take 1 capsule (100 mg total) by mouth every 8 (eight) hours. Patient not taking: Reported on 11/28/2023 09/15/22   Lyna Sandhoff, PA-C  chlorpheniramine-HYDROcodone  (TUSSIONEX) 10-8 MG/5ML Take 5 mLs by mouth 2 (two) times daily. Patient not taking: Reported on 11/28/2023 09/19/22   Lindle Rhea, MD     Objective    Physical Exam: Vitals:   11/28/23 1330 11/28/23 1530 11/28/23 1614 11/28/23 1857  BP: (!) 145/59 (!) 123/54 138/62 (!) 150/69  Pulse: 69 71 70 72  Resp: 16 18 (!) 23 17  Temp:   98.5 F (36.9 C) 98.2 F (36.8 C)  TempSrc:  Oral Oral  SpO2: 92% 92% 94% 96%  Weight:      Height:        General: appears to be stated age; alert, oriented Skin: warm, dry, no rash Head:  AT/[place] Mouth:  Oral mucosa membranes appear moist, normal dentition Neck: supple; trachea midline Heart:  RRR; did not appreciate any M/R/G Lungs: CTAB, did not appreciate any wheezes, rales, or rhonchi Abdomen: + BS; soft, ND, NT Vascular: 2+ pedal pulses b/l; 2+ radial pulses b/l Extremities: no peripheral edema, no muscle wasting Neuro: strength and sensation intact in upper and lower extremities  b/l ***   *** Neuro: 5/5 strength of the proximal and distal flexors and extensors of the upper and lower extremities bilaterally; sensation intact in upper and lower extremities b/l; cranial nerves II through XII grossly intact; no pronator drift; no evidence suggestive of slurred speech, dysarthria, or facial droop; Normal muscle tone. No tremors.  *** Neuro: In the setting of the patient's current mental status and associated inability to follow instructions, unable to perform full neurologic exam at this time.  As such, assessment of strength, sensation, and cranial nerves is limited at this time. Patient noted to spontaneously move all 4 extremities. No tremors.  ***    Labs on Admission: I have personally reviewed following labs and imaging studies  CBC: Recent Labs  Lab 11/28/23 0906  WBC 14.4*  HGB 13.1  HCT 39.0  MCV 92.2  PLT 320   Basic Metabolic Panel: Recent Labs  Lab 11/28/23 0906 11/28/23 1338  NA 139  --   K 4.3  --   CL 102  --   CO2 24  --   GLUCOSE 117*  --   BUN 19  --   CREATININE 0.93  --   CALCIUM  9.7  --   MG  --  2.0   GFR: Estimated Creatinine Clearance: 93.4 mL/min (by C-G formula based on SCr of 0.93 mg/dL). Liver Function Tests: Recent Labs  Lab 11/28/23 0906  AST 21  ALT 20  ALKPHOS 103  BILITOT 0.4  PROT 7.5  ALBUMIN 4.5   Recent Labs  Lab 11/28/23 0906  LIPASE 2,202*   No results for input(s): "AMMONIA" in the last 168 hours. Coagulation Profile: No results for input(s): "INR", "PROTIME" in the last 168 hours. Cardiac Enzymes: No results for input(s): "CKTOTAL", "CKMB", "CKMBINDEX", "TROPONINI" in the last 168 hours. BNP (last 3 results) No results for input(s): "PROBNP" in the last 8760 hours. HbA1C: No results for input(s): "HGBA1C" in the last 72 hours. CBG: No results for input(s): "GLUCAP" in the last 168 hours. Lipid Profile: Recent Labs    11/28/23 1338  TRIG 77   Thyroid Function Tests: No results for  input(s): "TSH", "T4TOTAL", "FREET4", "T3FREE", "THYROIDAB" in the last 72 hours. Anemia Panel: No results for input(s): "VITAMINB12", "FOLATE", "FERRITIN", "TIBC", "IRON", "RETICCTPCT" in the last 72 hours. Urine analysis:    Component Value Date/Time   COLORURINE YELLOW 05/28/2009 1606   APPEARANCEUR CLEAR 05/28/2009 1606   LABSPEC 1.026 05/28/2009 1606   PHURINE 7.0 05/28/2009 1606   GLUCOSEU NEGATIVE 05/28/2009 1606   HGBUR NEGATIVE 05/28/2009 1606   BILIRUBINUR NEGATIVE 05/28/2009 1606   KETONESUR NEGATIVE 05/28/2009 1606   PROTEINUR 100 (A) 05/28/2009 1606   UROBILINOGEN 0.2 05/28/2009 1606   NITRITE NEGATIVE 05/28/2009 1606   LEUKOCYTESUR NEGATIVE 05/28/2009 1606    Radiological Exams on Admission: CT ABDOMEN PELVIS W CONTRAST Result Date: 11/28/2023 CLINICAL DATA:  Abdominal pain acute,  nonlocalized EXAM: CT ABDOMEN AND PELVIS WITH CONTRAST TECHNIQUE: Multidetector CT imaging of the abdomen and pelvis was performed using the standard protocol following bolus administration of intravenous contrast. RADIATION DOSE REDUCTION: This exam was performed according to the departmental dose-optimization program which includes automated exposure control, adjustment of the mA and/or kV according to patient size and/or use of iterative reconstruction technique. CONTRAST:  100mL OMNIPAQUE IOHEXOL 300 MG/ML  SOLN COMPARISON:  None Available. FINDINGS: Lower chest: No infiltrates or consolidations, no pleural effusions coronary artery calcifications Hepatobiliary: Liver normal size no masses no biliary dilatation. Gallbladder unremarkable. No gallstones. Pancreas: Pancreas normal size however, inflammatory changes surrounds the distal third of the tail of the pancreas with fluid in the left peripancreatic tissues extending into the left lateral conal fascia findings correlate with mild acute pancreatitis without evidence of drainable fluid collections or pseudocyst formation. Spleen: Normal in size  without focal abnormality. Adrenals/Urinary Tract: Adrenal glands are normal size. Follow-up recommended. Kidneys are normal. No masses calcifications or hydronephrosis Stomach/Bowel: No small or large bowel obstruction or inflammatory changes. Moderate amount of residual fecal material throughout the colon without obstruction or constipation. Vascular/Lymphatic: Aortic atherosclerosis. No enlarged abdominal or pelvic lymph nodes. Reproductive: Prostate is unremarkable. Other: Bilateral inguinal scrotal hernias containing fat only bilateral hip arthroplasties Musculoskeletal: Visualized portion of the thoracolumbar spine and pelvic structures grossly unremarkable without evidence of fracture bony abnormalities or soft tissue masses. IMPRESSION: *Mild acute pancreatitis involving the distal third of the tail of the pancreas. No evidence of drainable fluid collections or pseudocyst formation. *Bilateral inguinal scrotal hernias containing fat only. *Aortic atherosclerosis. *Coronary artery calcifications. *Moderate amount of residual fecal material throughout the colon without obstruction or constipation. Electronically Signed   By: Fredrich Jefferson M.D.   On: 11/28/2023 11:47   DG Chest Portable 1 View Result Date: 11/28/2023 CLINICAL DATA:  Chest pain EXAM: PORTABLE CHEST 1 VIEW COMPARISON:  March 25 24 FINDINGS: The heart size and mediastinal contours are within normal limits. Both lungs are clear. The visualized skeletal structures are unremarkable. IMPRESSION: No active disease. Electronically Signed   By: Fredrich Jefferson M.D.   On: 11/28/2023 09:27      Assessment/Plan   Principal Problem:   Abdominal  pain   ***            ***                  ***                   ***                  ***                  ***                  ***                   ***                  ***                  ***                  ***                  ***                 ***                ***  DVT prophylaxis: SCD's ***  Code Status: Full code*** Family Communication: none*** Disposition Plan: Per Rounding Team Consults called: none***;  Admission status: ***     I SPENT GREATER THAN 75 *** MINUTES IN CLINICAL CARE TIME/MEDICAL DECISION-MAKING IN COMPLETING THIS ADMISSION.      Gattis Kass Ines Rebel DO [facility]  From 7PM - 7AM   11/28/2023, 7:41 PM   ***

## 2023-11-28 NOTE — ED Provider Notes (Signed)
 [place] [facility]MENT AT [facility] Provider Note   CSN: [id number] Arrival date & time: 11/28/23  5784     History  Chief Complaint  Patient presents with   Chest Pain    Tyler Tate is a 68 y.o. male. With past medical history of HTN, HLD presents with complaint of chest pain/abdominal pain. This started last night. Associated with nausea and belching. Patient reports normal BM yesterday and he is passing gas. Has not had recent exertional CP or SOB lately.  Patient reports he woke up with diffuse generalized abdominal pain that gradually got worse and moved into his chest/epigastric area. States that chest pain is center of chest and is nonradiating. Has not taken BP medication this morning.    Chest Pain      Home Medications Prior to Admission medications   Medication Sig Start Date End Date Taking? Authorizing Provider  amLODipine  (NORVASC ) 5 MG tablet Take 5 mg by mouth daily. 09/11/15   [provider]  atorvastatin  (LIPITOR) 40 MG tablet Take 40 mg by mouth daily. 05/01/15   [provider]  benzonatate  (TESSALON ) 100 MG capsule Take 1 capsule (100 mg total) by mouth every 8 (eight) hours. 09/15/22   Lyna Sandhoff, PA-C  chlorpheniramine-HYDROcodone  (TUSSIONEX) 10-8 MG/5ML Take 5 mLs by mouth 2 (two) times daily. 09/19/22   Lindle Rhea, MD  glucosamine-chondroitin 500-400 MG tablet Take 3 tablets by mouth daily.    [provider]  losartan -hydrochlorothiazide  (HYZAAR) 100-25 MG tablet Take 1 tablet by mouth daily. 05/10/15   [provider]  metoprolol  succinate (TOPROL -XL) 50 MG 24 hr tablet Take 50 mg by mouth daily. 05/01/15   [provider]  Multiple Vitamins-Minerals (MULTIVITAMIN ADULT PO) Take 1 tablet by mouth daily.    [provider]  omeprazole  (PRILOSEC) 20 MG capsule Take 1 capsule (20 mg total) by mouth daily. 11/16/20 04/27/22  Lesa Rape, MD  venlafaxine  XR (EFFEXOR -XR) 150  MG 24 hr capsule Take 150 mg by mouth daily with breakfast. 08/19/15   [provider]      Allergies    Penicillin g    Review of Systems   Review of Systems  Cardiovascular:  Positive for chest pain.    Physical Exam Updated Vital Signs BP (!) 172/80   Pulse 64   Temp 98.1 F (36.7 C) (Oral)   Resp 14   Ht 6\' 1"  (1.854 m)   Wt 97.5 kg   SpO2 98%   BMI 28.37 kg/m  Physical Exam Vitals and nursing note reviewed.  Constitutional:      General: He is not in acute distress.    Appearance: He is not toxic-appearing.  HENT:     Head: Normocephalic and atraumatic.  Eyes:     General: No scleral icterus.    Conjunctiva/sclera: Conjunctivae normal.  Cardiovascular:     Rate and Rhythm: Normal rate and regular rhythm.     Pulses: Normal pulses.     Heart sounds: Normal heart sounds.  Pulmonary:     Effort: Pulmonary effort is normal. No respiratory distress.     Breath sounds: Normal breath sounds.  Chest:     Chest wall: Tenderness present.  Abdominal:     General: Abdomen is flat. Bowel sounds are normal. There is no distension.     Palpations: Abdomen is soft. There is no mass.     Tenderness: There is abdominal tenderness.  Skin:    General: Skin is warm and dry.  Findings: No lesion.  Neurological:     General: No focal deficit present.     Mental Status: He is alert and oriented to person, place, and time. Mental status is at baseline.     ED Results / Procedures / Treatments   Labs (all labs ordered are listed, but only abnormal results are displayed) Labs Reviewed  BASIC METABOLIC PANEL WITH GFR - Abnormal; Notable for the following components:      Result Value   Glucose, Bld 117 (*)    All other components within normal limits  CBC - Abnormal; Notable for the following components:   WBC 14.4 (*)    All other components within normal limits  HEPATIC FUNCTION PANEL - Abnormal; Notable for the following components:   Indirect Bilirubin 0.2  (*)    All other components within normal limits  LIPASE, BLOOD - Abnormal; Notable for the following components:   Lipase 2,202 (*)    All other components within normal limits  URINE DRUG SCREEN  ETHANOL  MAGNESIUM  TRIGLYCERIDES  TROPONIN T, HIGH SENSITIVITY  TROPONIN T, HIGH SENSITIVITY    EKG None  Radiology CT ABDOMEN PELVIS W CONTRAST Result Date: 11/28/2023 CLINICAL DATA:  Abdominal pain acute, nonlocalized EXAM: CT ABDOMEN AND PELVIS WITH CONTRAST TECHNIQUE: Multidetector CT imaging of the abdomen and pelvis was performed using the standard protocol following bolus administration of intravenous contrast. RADIATION DOSE REDUCTION: This exam was performed according to the departmental dose-optimization program which includes automated exposure control, adjustment of the mA and/or kV according to patient size and/or use of iterative reconstruction technique. CONTRAST:  OMNIPAQUE  IOHEXOL  300 MG/ML  SOLN COMPARISON:  None Available. FINDINGS: Lower chest: No infiltrates or consolidations, no pleural effusions coronary artery calcifications Hepatobiliary: Liver normal size no masses no biliary dilatation. Gallbladder unremarkable. No gallstones. Pancreas: Pancreas normal size however, inflammatory changes surrounds the distal third of the tail of the pancreas with fluid in the left peripancreatic tissues extending into the left lateral conal fascia findings correlate with mild acute pancreatitis without evidence of drainable fluid collections or pseudocyst formation. Spleen: Normal in size without focal abnormality. Adrenals/Urinary Tract: Adrenal glands are normal size. Follow-up recommended. Kidneys are normal. No masses calcifications or hydronephrosis Stomach/Bowel: No small or large bowel obstruction or inflammatory changes. Moderate amount of residual fecal material throughout the colon without obstruction or constipation. Vascular/Lymphatic: Aortic atherosclerosis. No enlarged  abdominal or pelvic lymph nodes. Reproductive: Prostate is unremarkable. Other: Bilateral inguinal scrotal hernias containing fat only bilateral hip arthroplasties Musculoskeletal: Visualized portion of the thoracolumbar spine and pelvic structures grossly unremarkable without evidence of fracture bony abnormalities or soft tissue masses. IMPRESSION: *Mild acute pancreatitis involving the distal third of the tail of the pancreas. No evidence of drainable fluid collections or pseudocyst formation. *Bilateral inguinal scrotal hernias containing fat only. *Aortic atherosclerosis. *Coronary artery calcifications. *Moderate amount of residual fecal material throughout the colon without obstruction or constipation. Electronically Signed   By: Fredrich Jefferson M.D.   On: 11/28/2023 11:47   DG Chest Portable 1 View Result Date: 11/28/2023 CLINICAL DATA:  Chest pain EXAM: PORTABLE CHEST 1 VIEW COMPARISON:  March 25 24 FINDINGS: The heart size and mediastinal contours are within normal limits. Both lungs are clear. The visualized skeletal structures are unremarkable. IMPRESSION: No active disease. Electronically Signed   By: Fredrich Jefferson M.D.   On: 11/28/2023 09:27    Procedures Procedures    Medications Ordered in ED Medications  HYDROmorphone  (DILAUDID ) injection 0.5 mg (has  no administration in time range)  sodium chloride  0.9 % bolus 1,000 mL (has no administration in time range)  sodium chloride  0.9 % bolus 1,000 mL (has no administration in time range)  alum & mag hydroxide-simeth (MAALOX/MYLANTA) 200-200-20 MG/5ML suspension 30 mL (30 mLs Oral Given 11/28/23 0925)  iohexol  (OMNIPAQUE ) 300 MG/ML solution 100 mL (100 mLs Intravenous Contrast Given 11/28/23 1059)  HYDROmorphone  (DILAUDID ) injection 0.5 mg (0.5 mg Intravenous Given 11/28/23 1131)  ondansetron  (ZOFRAN ) injection 4 mg (4 mg Intravenous Given 11/28/23 1131)  sodium chloride  0.9 % bolus 1,000 mL (0 mLs Intravenous Stopped 11/28/23 1252)    ED Course/  Medical Decision Making/ A&P Clinical Course as of 11/28/23 1823  Tue Nov 28, 2023  1338 Accepted for admission with Dr Lydia Sams  [JB]    Clinical Course User Index [JB] Telitha Plath, Kandace Organ, PA-C                                 Medical Decision Making Amount and/or Complexity of Data Reviewed Labs: ordered. Radiology: ordered.  Risk OTC drugs. Prescription drug management. Decision regarding hospitalization.   This patient presents to the ED for concern of abdominal pain, this involves an extensive number of treatment options, and is a complaint that carries with it a high risk of complications and morbidity.  The differential diagnosis includes ACS, aortic dissection, PE cholecystitis, pancreatitis, small bowel obstruction   Co morbidities that complicate the patient evaluation  GERD, HTN, HLD     Lab Tests:  I personally interpreted labs.  The pertinent results include:      Imaging Studies ordered:  I ordered imaging studies including CT abd/pelvis  I independently visualized and interpreted imaging which showed acute pancreatitis  I agree with the radiologist interpretation   Cardiac Monitoring: / EKG:  The patient was maintained on a cardiac monitor.     Consultations Obtained:  I requested consultation with the hospitalist,  and discussed lab and imaging findings as well as pertinent plan - they recommend: admit   Problem List / ED Course / Critical interventions / Medication management  Patient presenting to emergency room with epigastric abdominal pain this evening with nausea.  No vomiting or change in bowel movement.  Patient hemodynamically stable well-appearing.  He is not febrile.  He is not meeting SIRS criteria.  On my exam he has generalized tenderness to palpation.  He is also reporting some lower chest pain.  Chest x-ray shows no pneumonia, no pneumothorax.  EKG without acute findings, troponin within normal limits thus doubt ACS.  Lipase is elevated  at 2000.  CT scan shows finding of acute mild uncomplicated pancreatitis.  Symptoms do seem consistent with pancreatitis.  Denies drinking alcohol within the last 3 years.  No history of gallstone.  I reviewed medications, without obvious culprit for pancreatitis.  Will check triglycerides and magnesium as well as urine drug screen and EtOH.  Will consult for admission for pancreatitis.  I ordered medication including maalox, Dilaudid , Zofran  and Reevaluation of the patient after these medicines showed that the patient stayed the same I have reviewed the patients home medicines and have made adjustments as needed   Plan Admit for acute pancreatitis          Final Clinical Impression(s) / ED Diagnoses Final diagnoses:  Acute pancreatitis without infection or necrosis, unspecified pancreatitis type    Rx / DC Orders ED Discharge Orders  None         Eudora Heron, PA-C 11/28/23 Melony Squibb, MD 12/05/23 [phone]

## 2023-11-28 NOTE — ED Notes (Signed)
 Spoke w/ lab about magnesium and triglycerides add-on

## 2023-11-28 NOTE — Progress Notes (Signed)
 Call [facility] at [phone] Re: Pt Tyler Tate, Tyler Tate # [id number] / Room: [facility]. Chest pain abd pain today am in epigastric area.  Pancreatitis/ no abscess on ct / lipase 2000. Pt has not had ETOH for 3 years.  IVF: 1L  Mag level.  No drugs or nsaids.   Vitals:   11/28/23 1135 11/28/23 1200 11/28/23 1230 11/28/23 1300  BP: (!) 150/65 (!) 148/80 (!) 151/51 (!) 150/42  Pulse: 64 69 73 67  Temp:   98 F (36.7 C)   Resp: 14 14 18  (!) 21  Height:      Weight:      SpO2: 99% 95% 91% 98%  TempSrc:      BMI (Calculated):       Plan : Accept  pt med tele bed.

## 2023-11-28 NOTE — ED Triage Notes (Signed)
 C/o CP that started as "indigestion". Symptoms started last night. No cardiac hx. Denies SHOB.

## 2023-11-29 ENCOUNTER — Encounter (HOSPITAL_COMMUNITY): Payer: Self-pay | Admitting: Internal Medicine

## 2023-11-29 DIAGNOSIS — K8500 Idiopathic acute pancreatitis without necrosis or infection: Secondary | ICD-10-CM | POA: Diagnosis not present

## 2023-11-29 DIAGNOSIS — K859 Acute pancreatitis without necrosis or infection, unspecified: Secondary | ICD-10-CM | POA: Diagnosis present

## 2023-11-29 DIAGNOSIS — Z8679 Personal history of other diseases of the circulatory system: Secondary | ICD-10-CM

## 2023-11-29 DIAGNOSIS — F32A Depression, unspecified: Secondary | ICD-10-CM | POA: Diagnosis present

## 2023-11-29 LAB — CBC WITH DIFFERENTIAL/PLATELET
Abs Immature Granulocytes: 0.03 10*3/uL (ref 0.00–0.07)
Basophils Absolute: 0.1 10*3/uL (ref 0.0–0.1)
Basophils Relative: 0 %
Eosinophils Absolute: 0.1 10*3/uL (ref 0.0–0.5)
Eosinophils Relative: 1 %
HCT: 33.5 % — ABNORMAL LOW (ref 39.0–52.0)
Hemoglobin: 11.1 g/dL — ABNORMAL LOW (ref 13.0–17.0)
Immature Granulocytes: 0 %
Lymphocytes Relative: 15 %
Lymphs Abs: 1.8 10*3/uL (ref 0.7–4.0)
MCH: 30.9 pg (ref 26.0–34.0)
MCHC: 33.1 g/dL (ref 30.0–36.0)
MCV: 93.3 fL (ref 80.0–100.0)
Monocytes Absolute: 1.5 10*3/uL — ABNORMAL HIGH (ref 0.1–1.0)
Monocytes Relative: 13 %
Neutro Abs: 8.6 10*3/uL — ABNORMAL HIGH (ref 1.7–7.7)
Neutrophils Relative %: 71 %
Platelets: 277 10*3/uL (ref 150–400)
RBC: 3.59 MIL/uL — ABNORMAL LOW (ref 4.22–5.81)
RDW: 12.3 % (ref 11.5–15.5)
WBC: 12 10*3/uL — ABNORMAL HIGH (ref 4.0–10.5)
nRBC: 0 % (ref 0.0–0.2)

## 2023-11-29 LAB — COMPREHENSIVE METABOLIC PANEL WITH GFR
ALT: 14 U/L (ref 0–44)
AST: 16 U/L (ref 15–41)
Albumin: 2.9 g/dL — ABNORMAL LOW (ref 3.5–5.0)
Alkaline Phosphatase: 57 U/L (ref 38–126)
Anion gap: 8 (ref 5–15)
BUN: 14 mg/dL (ref 8–23)
CO2: 23 mmol/L (ref 22–32)
Calcium: 8.3 mg/dL — ABNORMAL LOW (ref 8.9–10.3)
Chloride: 106 mmol/L (ref 98–111)
Creatinine, Ser: 0.9 mg/dL (ref 0.61–1.24)
GFR, Estimated: >60 mL/min (ref >60)
Glucose, Bld: 92 mg/dL (ref 70–99)
Potassium: 4 mmol/L (ref 3.5–5.1)
Sodium: 137 mmol/L (ref 135–145)
Total Bilirubin: 0.8 mg/dL (ref 0.0–1.2)
Total Protein: 6.2 g/dL — ABNORMAL LOW (ref 6.5–8.1)

## 2023-11-29 LAB — LIPASE, BLOOD: Lipase: 164 U/L — ABNORMAL HIGH (ref 11–51)

## 2023-11-29 LAB — MAGNESIUM: Magnesium: 1.8 mg/dL (ref 1.7–2.4)

## 2023-11-29 MED ORDER — HYDROMORPHONE HCL 1 MG/ML IJ SOLN
1.0000 mg | INTRAMUSCULAR | Status: DC | PRN
  Administered 2023-11-29: 1 mg via INTRAVENOUS
  Filled 2023-11-29: qty 1

## 2023-11-29 MED ORDER — METOPROLOL SUCCINATE ER 25 MG PO TB24
50.0000 mg | ORAL_TABLET | Freq: Every day | ORAL | Status: DC
  Administered 2023-11-30: 50 mg via ORAL
  Filled 2023-11-29: qty 2

## 2023-11-29 MED ORDER — OXYCODONE HCL 5 MG PO TABS
5.0000 mg | ORAL_TABLET | ORAL | Status: DC | PRN
  Administered 2023-11-29 (×2): 5 mg via ORAL
  Filled 2023-11-29 (×2): qty 1

## 2023-11-29 MED ORDER — LACTATED RINGERS IV SOLN: INTRAVENOUS | Status: DC

## 2023-11-29 MED ORDER — PANTOPRAZOLE SODIUM 40 MG PO TBEC
40.0000 mg | DELAYED_RELEASE_TABLET | Freq: Every day | ORAL | Status: DC
  Administered 2023-11-29 – 2023-11-30 (×2): 40 mg via ORAL
  Filled 2023-11-29 (×2): qty 1

## 2023-11-29 MED ORDER — MELATONIN 5 MG PO TABS
5.0000 mg | ORAL_TABLET | Freq: Once | ORAL | Status: AC
  Administered 2023-11-29: 5 mg via ORAL
  Filled 2023-11-29: qty 1

## 2023-11-29 MED ORDER — AMLODIPINE BESYLATE 5 MG PO TABS
5.0000 mg | ORAL_TABLET | Freq: Every day | ORAL | Status: DC
  Administered 2023-11-30: 5 mg via ORAL
  Filled 2023-11-29: qty 1

## 2023-11-29 MED ORDER — PANTOPRAZOLE SODIUM 40 MG IV SOLR
40.0000 mg | INTRAVENOUS | Status: DC
  Administered 2023-11-29: 40 mg via INTRAVENOUS
  Filled 2023-11-29: qty 10

## 2023-11-29 MED ORDER — VENLAFAXINE HCL ER 75 MG PO CP24
150.0000 mg | ORAL_CAPSULE | Freq: Every day | ORAL | Status: DC
  Administered 2023-11-30: 150 mg via ORAL
  Filled 2023-11-29: qty 2

## 2023-11-29 NOTE — Plan of Care (Signed)
  Problem: Education: Goal: Knowledge of General Education information will improve Description: Including pain rating scale, medication(s)/side effects and non-pharmacologic comfort measures 11/29/2023 0713 by Vicie Grain, RN Outcome: Progressing 11/29/2023 0713 by Vicie Grain, RN Outcome: Progressing   Problem: Health Behavior/Discharge Planning: Goal: Ability to manage health-related needs will improve 11/29/2023 0713 by Vicie Grain, RN Outcome: Progressing 11/29/2023 0713 by Vicie Grain, RN Outcome: Progressing

## 2023-11-29 NOTE — Plan of Care (Signed)

## 2023-11-29 NOTE — Progress Notes (Signed)
 PROGRESS NOTE    Tyler Tate  QMV:[id number] DOB: [birth year] DOA: 11/28/2023 PCP: Suan Elm, MD    Brief Narrative:   Tyler Tate is a 67 y.o. male with past medical history significant for HTN, HLD, GERD who presented to [facility] ED on 11/28/2023 with complaints of abdominal pain.  Patient reports 3-day of new onset sharp epigastric discomfort with radiation to the left upper abdominal quadrant, worse with palpation.  Also endorses nausea with 2-3 episodes of nonbloody, nonbilious emesis.  Denies any diarrhea, no melena, no hematochezia, no fever/chills/rigors, no myalgias.  No prior history of pancreatitis, distant denies any alcohol consumption over the last 3 years.  Denies recreational drug use.  No recent trauma or surgical procedures.  In the ED, temperature 98.1 F, HR 64, RR 14, BP 172/80, SpO2 98% on room air.  WBC 14.4, hemoglobin 13.1, platelet count 320.  Sodium 139, potassium 4.3, chloride 102, CO2 24, glucose 117, BUN 19, creatinine 0.93.  Lipase 2202.  AST 21, ALT 20, total loon 0.4.  High-sensitivity troponin less than 15 x 2.  Triglycerides 77.  UDS negative.  EKG with NSR, rate 66, normal intervals, no evidence of T wave or ST changes, no evidence of ST elevation.  Chest x-ray with no active cardiopulmonary disease process.  CT abdomen/pelvis with contrast with mild acute pancreatitis distal third of tail of pancreas, no evidence of drainable fluid collection or pseudocyst, bilateral inguinal scrotal hernias containing fat only, aortic atherosclerosis, coronary artery calcifications, moderate amount of residual fecal material throughout colon without obstruction/constipation.  TRH was consulted for admission and patient was transferred to [facility] for further evaluation and management of acute pancreatitis.  Assessment & Plan:    Acute idiopathic pancreatitis Patient presenting with 3-day history of progressive epigastric abdominal discomfort  associated with nausea/vomiting that was nonbilious/nonbloody.  Patient denies any alcohol use over the last 3 years, no previous history of pancreatitis.  LFTs within normal limits.  UDS negative.  Lipase elevated 2202.  Triglycerides 77.  CT abdomen/pelvis with mild acute pancreatitis of the distal third tail of pancreas but no drainable fluid collection or pseudocyst. -- Lipase 2202>164 -- Start clear liquid diet, further advancement as tolerates -- LR at 100 mL/h -- Oxycodone 5 mg p.o. q4h PRN moderate pain -- Dilaudid 1 mg IV q2h PRN severe pain -- Supportive care, antiemetics -- Repeat lipase in the a.m.  HTN Home regimen includes amlodipine  5 mg p.o. daily, losartan -HCTZ 100-25 mg p.o. daily, metoprolol  succinate 50 mg p.o. daily. -- Restart amlodipine  milligrams p.o. daily, metoprolol  succinate 50 mg p.o. daily -- Hold home losartan /HCTZ for now  HLD On atorvastatin  40 mg p.o. daily at baseline. -- Hold home statin for now  GERD -- Continue PPI  Anxiety/depression: -- Venlafaxine  150 mg p.o. daily  DVT prophylaxis: SCDs Start: 11/28/23 1909    Code Status: Full Code Family Communication: No family present at bedside this morning  Disposition Plan:  Level of care: Telemetry Medical Status is: Inpatient Remains inpatient appropriate because: IV fluids, needs further diet advancement with toleration before stable for discharge home, anticipate 1-2 days    Consultants:  None  Procedures:  None  Antimicrobials:  None   Subjective: Patient seen and examined at bedside, lying in bed.  Reports abdominal pain improved; "doubled IV pain medicine helped a lot".  Continues on IV fluids.  Lipase remarkably improved down to 164 this morning.  Starting clear liquid diet.  No other [place] complaints, questions, concerns at  this time.  Denies headache, no dizziness, no chest pain, no palpitations, no shortness of breath, no fever/chills/night sweats, no current nausea/vomiting,  no diarrhea, no focal weakness, no fatigue, no paresthesia.  No acute events overnight per nursing staff.  Objective: Vitals:   11/29/23 0409 11/29/23 0410 11/29/23 0740 11/29/23 1613  BP:  135/63 134/62 (!) 116/59  Pulse:  77 85 69  Resp:   17 18  Temp:  99.8 F (37.7 C) 98.7 F (37.1 C) 98.6 F (37 C)  TempSrc:  Oral Oral   SpO2:  95% 92% 95%  Weight: 101.3 kg     Height:        Intake/Output Summary (Last 24 hours) at 11/29/2023 1720 Last data filed at 11/29/2023 1300 Gross per 24 hour  Intake 478 ml  Output --  Net 478 ml   Filed Weights   11/28/23 0847 11/29/23 0409  Weight: 97.5 kg 101.3 kg    Examination:  Physical Exam: GEN: NAD, alert and oriented x 3, wd/wn HEENT: NCAT, PERRL, EOMI, sclera clear, MMM PULM: CTAB w/o wheezes/crackles, normal respiratory effort, room air CV: RRR w/o M/G/R GI: abd soft, mild epigastric TTP, nondistended, + BS MSK: no peripheral edema, moves all EXTR independently NEURO: No focal neurological deficit PSYCH: normal mood/affect Integumentary: No concerning rashes/lesions/wounds noted on exposed skin surfaces    Data Reviewed: I have personally reviewed following labs and imaging studies  CBC: Recent Labs  Lab 11/28/23 0906 11/29/23 0617  WBC 14.4* 12.0*  NEUTROABS  --  8.6*  HGB 13.1 11.1*  HCT 39.0 33.5*  MCV 92.2 93.3  PLT 320 277   Basic Metabolic Panel: Recent Labs  Lab 11/28/23 0906 11/28/23 1338 11/29/23 0617  NA 139  --  137  K 4.3  --  4.0  CL 102  --  106  CO2 24  --  23  GLUCOSE 117*  --  92  BUN 19  --  14  CREATININE 0.93  --  0.90  CALCIUM  9.7  --  8.3*  MG  --  2.0 1.8   GFR: Estimated Creatinine Clearance: 98.3 mL/min (by C-G formula based on SCr of 0.9 mg/dL). Liver Function Tests: Recent Labs  Lab 11/28/23 0906 11/29/23 0617  AST 21 16  ALT 20 14  ALKPHOS 103 57  BILITOT 0.4 0.8  PROT 7.5 6.2*  ALBUMIN 4.5 2.9*   Recent Labs  Lab 11/28/23 0906 11/29/23 0617  LIPASE 2,202*  164*   No results for input(s): "AMMONIA" in the last 168 hours. Coagulation Profile: No results for input(s): "INR", "PROTIME" in the last 168 hours. Cardiac Enzymes: No results for input(s): "CKTOTAL", "CKMB", "CKMBINDEX", "TROPONINI" in the last 168 hours. BNP (last 3 results) No results for input(s): "PROBNP" in the last 8760 hours. HbA1C: No results for input(s): "HGBA1C" in the last 72 hours. CBG: No results for input(s): "GLUCAP" in the last 168 hours. Lipid Profile: Recent Labs    11/28/23 1338  TRIG 77   Thyroid Function Tests: No results for input(s): "TSH", "T4TOTAL", "FREET4", "T3FREE", "THYROIDAB" in the last 72 hours. Anemia Panel: No results for input(s): "VITAMINB12", "FOLATE", "FERRITIN", "TIBC", "IRON", "RETICCTPCT" in the last 72 hours. Sepsis Labs: No results for input(s): "PROCALCITON", "LATICACIDVEN" in the last 168 hours.  No results found for this or any previous visit (from the past 240 hours).       Radiology Studies: CT ABDOMEN PELVIS W CONTRAST Result Date: 11/28/2023 CLINICAL DATA:  Abdominal pain acute, nonlocalized EXAM:  CT ABDOMEN AND PELVIS WITH CONTRAST TECHNIQUE: Multidetector CT imaging of the abdomen and pelvis was performed using the standard protocol following bolus administration of intravenous contrast. RADIATION DOSE REDUCTION: This exam was performed according to the departmental dose-optimization program which includes automated exposure control, adjustment of the mA and/or kV according to patient size and/or use of iterative reconstruction technique. CONTRAST:  100mL OMNIPAQUE IOHEXOL 300 MG/ML  SOLN COMPARISON:  None Available. FINDINGS: Lower chest: No infiltrates or consolidations, no pleural effusions coronary artery calcifications Hepatobiliary: Liver normal size no masses no biliary dilatation. Gallbladder unremarkable. No gallstones. Pancreas: Pancreas normal size however, inflammatory changes surrounds the distal third of the tail  of the pancreas with fluid in the left peripancreatic tissues extending into the left lateral conal fascia findings correlate with mild acute pancreatitis without evidence of drainable fluid collections or pseudocyst formation. Spleen: Normal in size without focal abnormality. Adrenals/Urinary Tract: Adrenal glands are normal size. Follow-up recommended. Kidneys are normal. No masses calcifications or hydronephrosis Stomach/Bowel: No small or large bowel obstruction or inflammatory changes. Moderate amount of residual fecal material throughout the colon without obstruction or constipation. Vascular/Lymphatic: Aortic atherosclerosis. No enlarged abdominal or pelvic lymph nodes. Reproductive: Prostate is unremarkable. Other: Bilateral inguinal scrotal hernias containing fat only bilateral hip arthroplasties Musculoskeletal: Visualized portion of the thoracolumbar spine and pelvic structures grossly unremarkable without evidence of fracture bony abnormalities or soft tissue masses. IMPRESSION: *Mild acute pancreatitis involving the distal third of the tail of the pancreas. No evidence of drainable fluid collections or pseudocyst formation. *Bilateral inguinal scrotal hernias containing fat only. *Aortic atherosclerosis. *Coronary artery calcifications. *Moderate amount of residual fecal material throughout the colon without obstruction or constipation. Electronically Signed   By: Fredrich Jefferson M.D.   On: 11/28/2023 11:47   DG Chest Portable 1 View Result Date: 11/28/2023 CLINICAL DATA:  Chest pain EXAM: PORTABLE CHEST 1 VIEW COMPARISON:  March 25 24 FINDINGS: The heart size and mediastinal contours are within normal limits. Both lungs are clear. The visualized skeletal structures are unremarkable. IMPRESSION: No active disease. Electronically Signed   By: Fredrich Jefferson M.D.   On: 11/28/2023 09:27        Scheduled Meds:  pantoprazole  (PROTONIX ) IV  40 mg Intravenous Q24H   Continuous Infusions:   LOS: 1  day    Time spent: 51 minutes spent on 11/29/2023 caring for this patient face-to-face including chart review, ordering labs/tests, documenting, discussion with nursing staff, consultants, updating family and interview/physical exam    Rema Care [place], DO [facility] Available via Epic secure chat 7am-7pm After these hours, please refer to coverage provider listed on [email or web address] 11/29/2023, 5:20 PM

## 2023-11-29 NOTE — Plan of Care (Signed)
   Problem: Education: Goal: Knowledge of General Education information will improve Description Including pain rating scale, medication(s)/side effects and non-pharmacologic comfort measures Outcome: Progressing   Problem: Health Behavior/Discharge Planning: Goal: Ability to manage health-related needs will improve Outcome: Progressing

## 2023-11-29 NOTE — Progress Notes (Signed)
 Advanced diet to full liquids per Dr. Eric [place] orders.

## 2023-11-29 NOTE — Progress Notes (Signed)
   11/29/23 1029  TOC Brief Assessment  Insurance and Status Reviewed  Patient has primary care physician Yes  Home environment has been reviewed spouse  Prior level of function: independent  Prior/Current Home Services No current home services  Social Drivers of Health Review SDOH reviewed no interventions necessary  Readmission risk has been reviewed Yes  Transition of care needs no transition of care needs at this time      Transition of Care Department [facility]) has reviewed patient and no TOC needs have been identified at this time. We will continue to monitor patient advancement through interdisciplinary progression rounds. If new patient transition needs arise, please place a TOC consult.

## 2023-11-30 DIAGNOSIS — K8500 Idiopathic acute pancreatitis without necrosis or infection: Secondary | ICD-10-CM | POA: Diagnosis not present

## 2023-11-30 LAB — CBC
HCT: 34 % — ABNORMAL LOW (ref 39.0–52.0)
Hemoglobin: 11.6 g/dL — ABNORMAL LOW (ref 13.0–17.0)
MCH: 31.8 pg (ref 26.0–34.0)
MCHC: 34.1 g/dL (ref 30.0–36.0)
MCV: 93.2 fL (ref 80.0–100.0)
Platelets: 264 10*3/uL (ref 150–400)
RBC: 3.65 MIL/uL — ABNORMAL LOW (ref 4.22–5.81)
RDW: 11.9 % (ref 11.5–15.5)
WBC: 11.1 10*3/uL — ABNORMAL HIGH (ref 4.0–10.5)
nRBC: 0 % (ref 0.0–0.2)

## 2023-11-30 LAB — LIPASE, BLOOD: Lipase: 42 U/L (ref 11–51)

## 2023-11-30 LAB — URINALYSIS, COMPLETE (UACMP) WITH MICROSCOPIC
Bacteria, UA: NONE SEEN
Bilirubin Urine: NEGATIVE
Glucose, UA: NEGATIVE mg/dL
Hgb urine dipstick: NEGATIVE
Ketones, ur: NEGATIVE mg/dL
Leukocytes,Ua: NEGATIVE
Nitrite: NEGATIVE
Protein, ur: NEGATIVE mg/dL
Specific Gravity, Urine: <1.005 — ABNORMAL LOW (ref 1.005–1.030)
pH: 6 (ref 5.0–8.0)

## 2023-11-30 LAB — BASIC METABOLIC PANEL WITH GFR
Anion gap: 11 (ref 5–15)
BUN: 11 mg/dL (ref 8–23)
CO2: 24 mmol/L (ref 22–32)
Calcium: 8.8 mg/dL — ABNORMAL LOW (ref 8.9–10.3)
Chloride: 105 mmol/L (ref 98–111)
Creatinine, Ser: 0.83 mg/dL (ref 0.61–1.24)
GFR, Estimated: >60 mL/min (ref >60)
Glucose, Bld: 100 mg/dL — ABNORMAL HIGH (ref 70–99)
Potassium: 3.6 mmol/L (ref 3.5–5.1)
Sodium: 140 mmol/L (ref 135–145)

## 2023-11-30 NOTE — Progress Notes (Signed)
 Advanced diet to soft  per Dr. Lyell Samuel [place]'s advance diet as tolerated order.

## 2023-11-30 NOTE — Progress Notes (Signed)
 Tyler Tate to be D/C'd  per MD order.  Discussed with the patient and all questions fully answered.  VSS, Skin clean, dry and intact without evidence of skin break down, no evidence of skin tears noted.  IV catheter discontinued intact. Site without signs and symptoms of complications. Dressing and pressure applied.  An After Visit Summary was printed and given to the patient.  D/c education completed with patient/family including follow up instructions, medication list, d/c activities limitations if indicated, with other d/c instructions as indicated by MD - patient able to verbalize understanding, all questions fully answered.   Patient instructed to return to ED, call 911, or call MD for any changes in condition.   Patient refused WC and ambulated with spouse to front entrance, and D/C home via private auto.

## 2023-11-30 NOTE — Discharge Summary (Signed)
 Physician Discharge Summary  Tyler Tate VHQ:[id number] DOB: Apr 06, 1956 DOA: 11/28/2023  PCP: Suan Elm, MD  Admit date: 11/28/2023 Discharge date: 11/30/2023  Admitted From: Home Disposition: Home  Recommendations for Outpatient Follow-up:  Follow up with PCP in 1-2 weeks Continue soft diet for 7 days can slowly advance as tolerates  Home Health: No Equipment/Devices: None  Discharge Condition: Stable CODE STATUS: Full code Diet recommendation: Soft diet  History of present illness:  Tyler Tate is a 68 y.o. male with past medical history significant for HTN, HLD, GERD who presented to [facility] ED on 11/28/2023 with complaints of abdominal pain.  Patient reports 3-day of new onset sharp epigastric discomfort with radiation to the left upper abdominal quadrant, worse with palpation.  Also endorses nausea with 2-3 episodes of nonbloody, nonbilious emesis.  Denies any diarrhea, no melena, no hematochezia, no fever/chills/rigors, no myalgias.  No prior history of pancreatitis, distant denies any alcohol consumption over the last 3 years.  Denies recreational drug use.  No recent trauma or surgical procedures.   In the ED, temperature 98.1 F, HR 64, RR 14, BP 172/80, SpO2 98% on room air.  WBC 14.4, hemoglobin 13.1, platelet count 320.  Sodium 139, potassium 4.3, chloride 102, CO2 24, glucose 117, BUN 19, creatinine 0.93.  Lipase 2202.  AST 21, ALT 20, total loon 0.4.  High-sensitivity troponin less than 15 x 2.  Triglycerides 77.  UDS negative.  EKG with NSR, rate 66, normal intervals, no evidence of T wave or ST changes, no evidence of ST elevation.  Chest x-ray with no active cardiopulmonary disease process.  CT abdomen/pelvis with contrast with mild acute pancreatitis distal third of tail of pancreas, no evidence of drainable fluid collection or pseudocyst, bilateral inguinal scrotal hernias containing fat only, aortic atherosclerosis, coronary artery calcifications,  moderate amount of residual fecal material throughout colon without obstruction/constipation.  TRH was consulted for admission and patient was transferred to [facility] for further evaluation and management of acute pancreatitis.  Hospital course:  Acute idiopathic pancreatitis Patient presenting with 3-day history of progressive epigastric abdominal discomfort associated with nausea/vomiting that was nonbilious/nonbloody.  Patient denies any alcohol use over the last 3 years, no previous history of pancreatitis.  LFTs within normal limits.  UDS negative.  Lipase elevated 2202.  Triglycerides 77.  CT abdomen/pelvis with mild acute pancreatitis of the distal third tail of pancreas but no drainable fluid collection or pseudocyst.  Patient was aggressively resuscitated with IV fluids, started on a clear liquid diet and slowly advanced with toleration.  Lipase improved/normalized to 42 at time of discharge.  Instructed patient to maintain soft/easy digestible diet over the next 7 days and slowly advance thereafter.  Outpatient follow-up with PCP.   HTN Home regimen includes amlodipine  5 mg p.o. daily, losartan -HCTZ 100-25 mg p.o. daily, metoprolol  succinate 50 mg p.o. daily.   HLD Atorvastatin  40 mg p.o. daily  GERD Continue PPI   Anxiety/depression: Venlafaxine  150 mg p.o. daily  Discharge Diagnoses:  Principal Problem:   Acute pancreatitis Active Problems:   Leukocytosis   GERD (gastroesophageal reflux disease)   HLD (hyperlipidemia)   Abdominal pain   History of essential hypertension   Depression    Discharge Instructions  Discharge Instructions     Call MD for:  difficulty breathing, headache or visual disturbances   Complete by: As directed    Call MD for:  extreme fatigue   Complete by: As directed    Call MD for:  persistant dizziness  or light-headedness   Complete by: As directed    Call MD for:  persistant nausea and vomiting   Complete by: As directed     Call MD for:  severe uncontrolled pain   Complete by: As directed    Call MD for:  temperature >100.4   Complete by: As directed    Diet - low sodium heart healthy   Complete by: As directed    Increase activity slowly   Complete by: As directed       Allergies as of 11/30/2023       Reactions   Penicillin G Rash        Medication List     STOP taking these medications    benzonatate  100 MG capsule Commonly known as: TESSALON    chlorpheniramine-HYDROcodone  10-8 MG/5ML Commonly known as: TUSSIONEX       TAKE these medications    amLODipine  5 MG tablet Commonly known as: NORVASC  Take 5 mg by mouth daily.   atorvastatin  40 MG tablet Commonly known as: LIPITOR Take 40 mg by mouth daily.   glucosamine-chondroitin 500-400 MG tablet Take 3 tablets by mouth daily.   losartan -hydrochlorothiazide  100-25 MG tablet Commonly known as: HYZAAR Take 1 tablet by mouth daily.   metoprolol  succinate 50 MG 24 hr tablet Commonly known as: TOPROL -XL Take 50 mg by mouth daily.   MULTIVITAMIN ADULT PO Take 1 tablet by mouth daily.   omeprazole  20 MG capsule Commonly known as: PRILOSEC Take 1 capsule (20 mg total) by mouth daily.   Tadalafil 2.5 MG Tabs Take 1 tablet by mouth daily.   TURMERIC PO Take 1 tablet by mouth daily.   venlafaxine  XR 150 MG 24 hr capsule Commonly known as: EFFEXOR -XR Take 150 mg by mouth daily with breakfast.        Follow-up Information     Suan Elm, MD. Schedule an appointment as soon as possible for a visit in 1 week(s).   Specialty: Internal Medicine Contact information: [address] [place] [address] [phone]                Allergies  Allergen Reactions   Penicillin G Rash    Consultations: None   Procedures/Studies: CT ABDOMEN PELVIS W CONTRAST Result Date: 11/28/2023 CLINICAL DATA:  Abdominal pain acute, nonlocalized EXAM: CT ABDOMEN AND PELVIS WITH CONTRAST TECHNIQUE: Multidetector CT  imaging of the abdomen and pelvis was performed using the standard protocol following bolus administration of intravenous contrast. RADIATION DOSE REDUCTION: This exam was performed according to the departmental dose-optimization program which includes automated exposure control, adjustment of the mA and/or kV according to patient size and/or use of iterative reconstruction technique. CONTRAST:  100mL OMNIPAQUE IOHEXOL 300 MG/ML  SOLN COMPARISON:  None Available. FINDINGS: Lower chest: No infiltrates or consolidations, no pleural effusions coronary artery calcifications Hepatobiliary: Liver normal size no masses no biliary dilatation. Gallbladder unremarkable. No gallstones. Pancreas: Pancreas normal size however, inflammatory changes surrounds the distal third of the tail of the pancreas with fluid in the left peripancreatic tissues extending into the left lateral conal fascia findings correlate with mild acute pancreatitis without evidence of drainable fluid collections or pseudocyst formation. Spleen: Normal in size without focal abnormality. Adrenals/Urinary Tract: Adrenal glands are normal size. Follow-up recommended. Kidneys are normal. No masses calcifications or hydronephrosis Stomach/Bowel: No small or large bowel obstruction or inflammatory changes. Moderate amount of residual fecal material throughout the colon without obstruction or constipation. Vascular/Lymphatic: Aortic atherosclerosis. No enlarged abdominal or pelvic lymph nodes.  Reproductive: Prostate is unremarkable. Other: Bilateral inguinal scrotal hernias containing fat only bilateral hip arthroplasties Musculoskeletal: Visualized portion of the thoracolumbar spine and pelvic structures grossly unremarkable without evidence of fracture bony abnormalities or soft tissue masses. IMPRESSION: *Mild acute pancreatitis involving the distal third of the tail of the pancreas. No evidence of drainable fluid collections or pseudocyst formation. *Bilateral  inguinal scrotal hernias containing fat only. *Aortic atherosclerosis. *Coronary artery calcifications. *Moderate amount of residual fecal material throughout the colon without obstruction or constipation. Electronically Signed   By: Fredrich Jefferson M.D.   On: 11/28/2023 11:47   DG Chest Portable 1 View Result Date: 11/28/2023 CLINICAL DATA:  Chest pain EXAM: PORTABLE CHEST 1 VIEW COMPARISON:  March 25 24 FINDINGS: The heart size and mediastinal contours are within normal limits. Both lungs are clear. The visualized skeletal structures are unremarkable. IMPRESSION: No active disease. Electronically Signed   By: Fredrich Jefferson M.D.   On: 11/28/2023 09:27     Subjective: Patient seen examined bedside, sitting in bedside chair.  Spouse present.  Tolerating advance diet.  Complaining of some "bloating" but abdominal pain relatively resolved.  Discussed eating a soft diet that is easily digestible over next 7 days and slowly advancing as he tolerates.  Also discussed to take it "easy" over the next few days before attempting to increase his physical activity.  Patient with nocomplaints, concerns or questions at this time.  Denies headache, no dizziness, no chest pain, no palpitations, no shortness of breath, no abdominal pain, no fever/chills/night sweats, no nausea/vomiting/diarrhea, no focal weakness, no fatigue, no paresthesias.  No acute events overnight per nursing staff.  Discharge Exam: Vitals:   11/30/23 0745 11/30/23 0902  BP: 127/62 127/62  Pulse: 74 74  Resp: 17   Temp: 98.7 F (37.1 C)   SpO2: 100%    Vitals:   11/30/23 0441 11/30/23 0500 11/30/23 0745 11/30/23 0902  BP: (!) 123/55  127/62 127/62  Pulse: 66  74 74  Resp: 18  17   Temp: 98.8 F (37.1 C)  98.7 F (37.1 C)   TempSrc:   Oral   SpO2: 96%  100%   Weight:  103.8 kg    Height:        Physical Exam: GEN: NAD, alert and oriented x 3, wd/wn HEENT: NCAT, PERRL, EOMI, sclera clear, MMM PULM: CTAB w/o wheezes/crackles,  normal respiratory effort, on room air CV: RRR w/o M/G/R GI: abd soft, NTND, + BS MSK: no peripheral edema, muscle strength globally intact 5/5 bilateral upper/lower extremities NEURO: CN II-XII intact, no focal deficits, sensation to light touch intact PSYCH: normal mood/affect Integumentary: dry/intact, no rashes or wounds    The results of significant diagnostics from this hospitalization (including imaging, microbiology, ancillary and laboratory) are listed below for reference.     Microbiology: No results found for this or any previous visit (from the past 240 hours).   Labs: BNP (last 3 results) No results for input(s): "BNP" in the last 8760 hours. Basic Metabolic Panel: Recent Labs  Lab 11/28/23 0906 11/28/23 1338 11/29/23 0617 11/30/23 0603  NA 139  --  137 140  K 4.3  --  4.0 3.6  CL 102  --  106 105  CO2 24  --  23 24  GLUCOSE 117*  --  92 100*  BUN 19  --  14 11  CREATININE 0.93  --  0.90 0.83  CALCIUM  9.7  --  8.3* 8.8*  MG  --  2.0 1.8  --  Liver Function Tests: Recent Labs  Lab 11/28/23 0906 11/29/23 0617  AST 21 16  ALT 20 14  ALKPHOS 103 57  BILITOT 0.4 0.8  PROT 7.5 6.2*  ALBUMIN 4.5 2.9*   Recent Labs  Lab 11/28/23 0906 11/29/23 0617 11/30/23 0603  LIPASE 2,202* 164* 42   No results for input(s): "AMMONIA" in the last 168 hours. CBC: Recent Labs  Lab 11/28/23 0906 11/29/23 0617 11/30/23 0603  WBC 14.4* 12.0* 11.1*  NEUTROABS  --  8.6*  --   HGB 13.1 11.1* 11.6*  HCT 39.0 33.5* 34.0*  MCV 92.2 93.3 93.2  PLT 320 277 264   Cardiac Enzymes: No results for input(s): "CKTOTAL", "CKMB", "CKMBINDEX", "TROPONINI" in the last 168 hours. BNP: Invalid input(s): "POCBNP" CBG: No results for input(s): "GLUCAP" in the last 168 hours. D-Dimer No results for input(s): "DDIMER" in the last 72 hours. Hgb A1c No results for input(s): "HGBA1C" in the last 72 hours. Lipid Profile Recent Labs    11/28/23 1338  TRIG 77   Thyroid  function studies No results for input(s): "TSH", "T4TOTAL", "T3FREE", "THYROIDAB" in the last 72 hours.  Invalid input(s): "FREET3" Anemia work up No results for input(s): "VITAMINB12", "FOLATE", "FERRITIN", "TIBC", "IRON", "RETICCTPCT" in the last 72 hours. Urinalysis    Component Value Date/Time   COLORURINE YELLOW 11/30/2023 0717   APPEARANCEUR CLEAR 11/30/2023 0717   LABSPEC <1.005 (L) 11/30/2023 0717   PHURINE 6.0 11/30/2023 0717   GLUCOSEU NEGATIVE 11/30/2023 0717   HGBUR NEGATIVE 11/30/2023 0717   BILIRUBINUR NEGATIVE 11/30/2023 0717   KETONESUR NEGATIVE 11/30/2023 0717   PROTEINUR NEGATIVE 11/30/2023 0717   UROBILINOGEN 0.2 05/28/2009 1606   NITRITE NEGATIVE 11/30/2023 0717   LEUKOCYTESUR NEGATIVE 11/30/2023 0717   Sepsis Labs Recent Labs  Lab 11/28/23 0906 11/29/23 0617 11/30/23 0603  WBC 14.4* 12.0* 11.1*   Microbiology No results found for this or any previous visit (from the past 240 hours).   Time coordinating discharge: Over 30 minutes  SIGNED:   Rema Care [place], DO  [facility] 11/30/2023, 10:36 AM

## 2023-11-30 NOTE — Plan of Care (Signed)

## 2023-11-30 NOTE — Plan of Care (Signed)

## 2023-11-30 NOTE — Progress Notes (Signed)
 Pt complained of not being able to take deep breaths. Pt given incentive spirometer and instructed on how to use it properly. Pt demonstrated how to properly use incentive spirometer.

## 2023-11-30 NOTE — Progress Notes (Signed)
 Pt ate soft diet tolerated well and had a bowel movement. Will d'c pt per MD orders.

## 2023-12-06 MED FILL — Hydromorphone HCl Inj 2 MG/ML: INTRAMUSCULAR | Qty: 1 | Status: AC

## 2024-05-01 ENCOUNTER — Other Ambulatory Visit (HOSPITAL_COMMUNITY): Payer: Self-pay | Admitting: Internal Medicine

## 2024-05-01 DIAGNOSIS — R011 Cardiac murmur, unspecified: Secondary | ICD-10-CM

## 2024-05-15 ENCOUNTER — Encounter: Payer: Self-pay | Admitting: Podiatry

## 2024-05-15 ENCOUNTER — Ambulatory Visit (INDEPENDENT_AMBULATORY_CARE_PROVIDER_SITE_OTHER): Admitting: Podiatry

## 2024-05-15 ENCOUNTER — Ambulatory Visit (INDEPENDENT_AMBULATORY_CARE_PROVIDER_SITE_OTHER)

## 2024-05-15 VITALS — Ht 73.0 in | Wt 228.0 lb

## 2024-05-15 DIAGNOSIS — M7732 Calcaneal spur, left foot: Secondary | ICD-10-CM

## 2024-05-15 DIAGNOSIS — M7661 Achilles tendinitis, right leg: Secondary | ICD-10-CM | POA: Diagnosis not present

## 2024-05-15 DIAGNOSIS — M7662 Achilles tendinitis, left leg: Secondary | ICD-10-CM | POA: Diagnosis not present

## 2024-05-15 DIAGNOSIS — M7731 Calcaneal spur, right foot: Secondary | ICD-10-CM | POA: Diagnosis not present

## 2024-05-15 MED ORDER — TRIAMCINOLONE ACETONIDE 10 MG/ML IJ SUSP
10.0000 mg | Freq: Once | INTRAMUSCULAR | Status: AC
  Administered 2024-05-15: 10 mg via INTRA_ARTICULAR

## 2024-05-15 NOTE — Progress Notes (Signed)
 Subjective:   Patient ID: Tyler Tate, male   DOB: 68 y.o.   MRN: [id number]   HPI Patient presents with several month history of inflammation in the lateral side of both Achilles would like to avoid surgery but states he gets sore left over right.  Patient has not been seen for a number of years does not smoke likes to be active   Review of Systems  All other systems reviewed and are negative.       Objective:  Physical Exam Vitals and nursing note reviewed.  Constitutional:      Appearance: He is well-developed.  Pulmonary:     Effort: Pulmonary effort is normal.  Musculoskeletal:        General: Normal range of motion.  Skin:    General: Skin is warm.  Neurological:     Mental Status: He is alert.     Neurovascular status found to be intact muscle strength was found to be adequate range of motion within normal limits.  Patient has mild equinus has prominence of the lateral side of the posterior heel region bilateral with inflammation surrounding the area left over right.  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  I do think if surgery is necessary would just require a removal of bone spur and hopefully not have to reattach the tendon and I explained that to him  X-rays indicate there is some spur formation bilateral but has not intensified from previous visit and appears to be more on the posterior lateral  side so hard to get complete picture of the problem

## 2024-05-30 ENCOUNTER — Ambulatory Visit (HOSPITAL_COMMUNITY)
Admission: RE | Admit: 2024-05-30 | Discharge: 2024-05-30 | Disposition: A | Source: Ambulatory Visit | Attending: Surgery | Admitting: Surgery

## 2024-05-30 DIAGNOSIS — R011 Cardiac murmur, unspecified: Secondary | ICD-10-CM | POA: Insufficient documentation

## 2024-05-30 LAB — ECHOCARDIOGRAM COMPLETE
AR max vel: 1.88 cm2
AV Area VTI: 1.8 cm2
AV Area mean vel: 1.65 cm2
AV Mean grad: 10 mmHg
AV Peak grad: 19 mmHg
Ao pk vel: 2.18 m/s
Area-P 1/2: 2.93 cm2
S' Lateral: 2.62 cm
